# Patient Record
Sex: Female | Born: 1970 | Race: White | Hispanic: No | Marital: Married | Smoking: Never smoker
Health system: Southern US, Community
[De-identification: ages and names within clinical notes are randomized; demographics above are authoritative.]

## PROBLEM LIST (undated history)

## (undated) DIAGNOSIS — R569 Unspecified convulsions: Secondary | ICD-10-CM

## (undated) DIAGNOSIS — R51 Headache: Secondary | ICD-10-CM

## (undated) DIAGNOSIS — K219 Gastro-esophageal reflux disease without esophagitis: Secondary | ICD-10-CM

## (undated) DIAGNOSIS — R519 Headache, unspecified: Secondary | ICD-10-CM

## (undated) DIAGNOSIS — I10 Essential (primary) hypertension: Secondary | ICD-10-CM

## (undated) DIAGNOSIS — E063 Autoimmune thyroiditis: Secondary | ICD-10-CM

## (undated) DIAGNOSIS — E039 Hypothyroidism, unspecified: Secondary | ICD-10-CM

## (undated) HISTORY — PX: CERVICAL CONIZATION W/BX: SHX1330

## (undated) HISTORY — PX: WISDOM TOOTH EXTRACTION: SHX21

---

## 2004-09-14 ENCOUNTER — Ambulatory Visit (HOSPITAL_COMMUNITY): Admission: RE | Admit: 2004-09-14 | Discharge: 2004-09-14 | Payer: Self-pay | Admitting: Family Medicine

## 2006-06-25 ENCOUNTER — Ambulatory Visit (HOSPITAL_COMMUNITY): Admission: RE | Admit: 2006-06-25 | Discharge: 2006-06-25 | Payer: Self-pay | Admitting: Family Medicine

## 2006-12-31 ENCOUNTER — Ambulatory Visit (HOSPITAL_COMMUNITY): Admission: RE | Admit: 2006-12-31 | Discharge: 2006-12-31 | Payer: Self-pay | Admitting: Family Medicine

## 2008-07-27 ENCOUNTER — Ambulatory Visit (HOSPITAL_COMMUNITY): Admission: RE | Admit: 2008-07-27 | Discharge: 2008-07-27 | Payer: Self-pay | Admitting: Family Medicine

## 2008-12-29 ENCOUNTER — Ambulatory Visit (HOSPITAL_COMMUNITY): Admission: RE | Admit: 2008-12-29 | Discharge: 2008-12-29 | Payer: Self-pay | Admitting: Endocrinology

## 2009-08-19 ENCOUNTER — Ambulatory Visit (HOSPITAL_COMMUNITY): Admission: RE | Admit: 2009-08-19 | Discharge: 2009-08-19 | Payer: Self-pay | Admitting: Internal Medicine

## 2009-12-10 ENCOUNTER — Encounter (HOSPITAL_COMMUNITY): Admission: RE | Admit: 2009-12-10 | Discharge: 2010-01-09 | Payer: Self-pay | Admitting: Family Medicine

## 2010-04-14 ENCOUNTER — Encounter
Admission: RE | Admit: 2010-04-14 | Discharge: 2010-04-14 | Payer: Self-pay | Source: Home / Self Care | Attending: Obstetrics and Gynecology | Admitting: Obstetrics and Gynecology

## 2010-07-08 ENCOUNTER — Other Ambulatory Visit: Payer: Self-pay | Admitting: Endocrinology

## 2010-07-08 DIAGNOSIS — E041 Nontoxic single thyroid nodule: Secondary | ICD-10-CM

## 2010-07-25 ENCOUNTER — Ambulatory Visit
Admission: RE | Admit: 2010-07-25 | Discharge: 2010-07-25 | Disposition: A | Discharge status: Home / Self Care | Payer: Managed Care, Other (non HMO) | Source: Ambulatory Visit | Attending: Endocrinology | Admitting: Endocrinology

## 2010-07-25 DIAGNOSIS — E041 Nontoxic single thyroid nodule: Secondary | ICD-10-CM

## 2011-02-02 ENCOUNTER — Emergency Department (INDEPENDENT_AMBULATORY_CARE_PROVIDER_SITE_OTHER): Payer: Managed Care, Other (non HMO)

## 2011-02-02 ENCOUNTER — Encounter: Payer: Self-pay | Admitting: *Deleted

## 2011-02-02 ENCOUNTER — Emergency Department (HOSPITAL_COMMUNITY)
Admission: EM | Admit: 2011-02-02 | Discharge: 2011-02-02 | Disposition: A | Discharge status: Home / Self Care | Payer: Managed Care, Other (non HMO) | Source: Home / Self Care | Attending: Emergency Medicine | Admitting: Emergency Medicine

## 2011-02-02 DIAGNOSIS — S63619A Unspecified sprain of unspecified finger, initial encounter: Secondary | ICD-10-CM

## 2011-02-02 DIAGNOSIS — S6390XA Sprain of unspecified part of unspecified wrist and hand, initial encounter: Secondary | ICD-10-CM

## 2011-02-02 HISTORY — DX: Autoimmune thyroiditis: E06.3

## 2011-02-02 HISTORY — DX: Essential (primary) hypertension: I10

## 2011-02-02 MED ORDER — IBUPROFEN 800 MG PO TABS
ORAL_TABLET | ORAL | Status: AC
  Filled 2011-02-02: qty 1

## 2011-02-02 MED ORDER — IBUPROFEN 800 MG PO TABS
800.0000 mg | ORAL_TABLET | Freq: Once | ORAL | Status: AC
  Administered 2011-02-02: 800 mg via ORAL

## 2011-02-02 MED ORDER — IBUPROFEN 800 MG PO TABS: 800.0000 mg | ORAL_TABLET | Freq: Once | ORAL | Status: AC

## 2011-02-02 NOTE — ED Notes (Signed)
Reports falling onto right hand with RUE behind her @ approx 1600.  C/O pain distal right hand, 2nd & 3rd digits, radiating into right wrist.  Has not taken any measures to alleviate sxs.  CMS intact.

## 2011-02-02 NOTE — ED Provider Notes (Signed)
Medical screening examination/treatment/procedure(s) were performed by non-physician practitioner and as supervising physician I was immediately available for consultation/collaboration.  Raynald Blend, MD 02/02/11 2206

## 2011-02-02 NOTE — ED Provider Notes (Signed)
History     CSN: [id number] Arrival date & time: 02/02/2011  7:42 PM   First MD Initiated Contact with Patient 02/02/11 1953      Chief Complaint  Patient presents with  . Extremity Pain    Right Hand & 2nd, 3rd digit Pain; Fall    (Consider location/radiation/quality/duration/timing/severity/associated sxs/prior treatment) HPI Comments: Pt states she slipped on her brick walkway and fell backwards catching herself with her Rt hand. Pain Rt 2nd and 3rd digits with swelling. Minimal wrist pain. No other injuries. Denies HA or LOC.  Patient is a 40 y.o. female presenting with extremity pain. The history is provided by the patient.  Extremity Pain This is a new problem. The current episode started 3 to 5 hours ago. The problem occurs constantly. The problem has not changed since onset.Pertinent negatives include no chest pain, no abdominal pain, no headaches and no shortness of breath. The symptoms are aggravated by bending. The symptoms are relieved by nothing. She has tried nothing for the symptoms.    Past Medical History  Diagnosis Date  . Hashimoto disease   . Hypertension     Past Surgical History  Procedure Date  . Cervical conization w/bx     History reviewed. No pertinent family history.  History  Substance Use Topics  . Smoking status: Never Smoker   . Smokeless tobacco: Never Used  . Alcohol Use: No    OB History    Grav Para Term Preterm Abortions TAB SAB Ect Mult Living                  Review of Systems  Respiratory: Negative for shortness of breath.   Cardiovascular: Negative for chest pain.  Gastrointestinal: Negative for abdominal pain.  Musculoskeletal: Positive for joint swelling. Negative for back pain.  Skin: Negative for color change and wound.  Neurological: Negative for numbness and headaches.    Allergies  Amoxicillin  Home Medications   Current Outpatient Rx  Name Route Sig Dispense Refill  . SYNTHROID PO Oral Take by mouth.        Marland Kitchen LOSARTAN POTASSIUM 50 MG PO TABS Oral Take 50 mg by mouth daily.        BP 144/93  Pulse 90  Temp(Src) 98.3 F (36.8 C) (Oral)  Resp 16  SpO2 100%  Physical Exam  Constitutional: She appears well-developed and well-nourished. No distress.  HENT:  Head: Atraumatic.  Musculoskeletal:       Right hand: She exhibits bony tenderness (Mild TTP 2nd digit from MCP to PIP. Mild TTP 3rd digit MCP and proximal phalanx, with increased TTP 3rd digit at PIP) and swelling (3rd digit). She exhibits normal range of motion, normal capillary refill and no deformity. normal sensation noted. Normal strength noted.  Neurological: She is alert.  Skin: Skin is warm and dry. No abrasion, no bruising and no laceration noted. No erythema.  Psychiatric: She has a normal mood and affect.    ED Course  Procedures (including critical care time)  Labs Reviewed - No data to display No results found.   No diagnosis found.    MDM  Xray neg, reviewed by myself and radiologist.        Melody Comas, PA 02/02/11 2124

## 2011-02-24 ENCOUNTER — Other Ambulatory Visit: Payer: Self-pay | Admitting: Obstetrics and Gynecology

## 2011-02-24 DIAGNOSIS — Z1231 Encounter for screening mammogram for malignant neoplasm of breast: Secondary | ICD-10-CM

## 2011-04-17 ENCOUNTER — Ambulatory Visit
Admission: RE | Admit: 2011-04-17 | Discharge: 2011-04-17 | Disposition: A | Discharge status: Home / Self Care | Payer: Managed Care, Other (non HMO) | Source: Ambulatory Visit | Attending: Obstetrics and Gynecology | Admitting: Obstetrics and Gynecology

## 2011-04-17 ENCOUNTER — Other Ambulatory Visit: Payer: Self-pay | Admitting: Obstetrics and Gynecology

## 2011-04-17 DIAGNOSIS — Z1231 Encounter for screening mammogram for malignant neoplasm of breast: Secondary | ICD-10-CM

## 2011-11-13 ENCOUNTER — Emergency Department (HOSPITAL_COMMUNITY): Payer: Managed Care, Other (non HMO)

## 2011-11-13 ENCOUNTER — Encounter (HOSPITAL_COMMUNITY): Payer: Self-pay | Admitting: *Deleted

## 2011-11-13 ENCOUNTER — Emergency Department (HOSPITAL_COMMUNITY)
Admission: EM | Admit: 2011-11-13 | Discharge: 2011-11-14 | Disposition: A | Discharge status: Home / Self Care | Payer: Managed Care, Other (non HMO) | Attending: Emergency Medicine | Admitting: Emergency Medicine

## 2011-11-13 DIAGNOSIS — I10 Essential (primary) hypertension: Secondary | ICD-10-CM | POA: Insufficient documentation

## 2011-11-13 DIAGNOSIS — S92919A Unspecified fracture of unspecified toe(s), initial encounter for closed fracture: Secondary | ICD-10-CM

## 2011-11-13 DIAGNOSIS — E063 Autoimmune thyroiditis: Secondary | ICD-10-CM | POA: Insufficient documentation

## 2011-11-13 DIAGNOSIS — IMO0002 Reserved for concepts with insufficient information to code with codable children: Secondary | ICD-10-CM | POA: Insufficient documentation

## 2011-11-13 MED ORDER — HYDROCODONE-ACETAMINOPHEN 5-325 MG PO TABS: ORAL_TABLET | ORAL | Status: AC

## 2011-11-13 MED ORDER — IBUPROFEN 800 MG PO TABS
800.0000 mg | ORAL_TABLET | Freq: Once | ORAL | Status: AC
  Administered 2011-11-13: 800 mg via ORAL
  Filled 2011-11-13: qty 1

## 2011-11-13 MED ORDER — LIDOCAINE HCL (PF) 1 % IJ SOLN
INTRAMUSCULAR | Status: AC
  Administered 2011-11-13: 23:00:00
  Filled 2011-11-13: qty 5

## 2011-11-13 NOTE — ED Provider Notes (Signed)
History     CSN: [id number]  Arrival date & time 11/13/11  2249   First MD Initiated Contact with Patient 11/13/11 2306      Chief Complaint  Patient presents with  . Toe Injury    (Consider location/radiation/quality/duration/timing/severity/associated sxs/prior treatment) HPI Comments: Patient c/o pain and deformity of the left fifth toe that occurred when she struck her foot against a large trunk.  She denies ankle pain, numbness or weakness.  Patient is a 41 y.o. female presenting with toe pain. The history is provided by the patient.  Toe Pain This is a new problem. Episode onset: just PTA. The problem occurs constantly. The problem has been unchanged. Associated symptoms include arthralgias and joint swelling. Pertinent negatives include no chills, fever, numbness, vomiting or weakness. The symptoms are aggravated by standing and walking (palpation). She has tried nothing for the symptoms. The treatment provided no relief.    Past Medical History  Diagnosis Date  . Hashimoto disease   . Hypertension     Past Surgical History  Procedure Date  . Cervical conization w/bx     History reviewed. No pertinent family history.  History  Substance Use Topics  . Smoking status: Never Smoker   . Smokeless tobacco: Never Used  . Alcohol Use: No    OB History    Grav Para Term Preterm Abortions TAB SAB Ect Mult Living                  Review of Systems  Constitutional: Negative for fever and chills.  Gastrointestinal: Negative for vomiting.  Genitourinary: Negative for dysuria and difficulty urinating.  Musculoskeletal: Positive for joint swelling and arthralgias. Negative for back pain.  Skin: Negative for color change and wound.  Neurological: Negative for weakness and numbness.  All other systems reviewed and are negative.    Allergies  Amoxicillin  Home Medications   Current Outpatient Rx  Name Route Sig Dispense Refill  . SYNTHROID PO Oral Take by mouth.       Marland Kitchen LOSARTAN POTASSIUM 50 MG PO TABS Oral Take 50 mg by mouth daily.        BP 140/77  Pulse 93  Temp 98.4 F (36.9 C) (Oral)  Resp 20  Ht 5\' 6"  (1.676 m)  Wt 173 lb (78.472 kg)  BMI 27.92 kg/m2  SpO2 97%  LMP 10/28/2011  Physical Exam  Nursing note and vitals reviewed. Constitutional: She is oriented to person, place, and time. She appears well-developed and well-nourished. No distress.  HENT:  Head: Normocephalic and atraumatic.  Cardiovascular: Normal rate, regular rhythm, normal heart sounds and intact distal pulses.   Pulmonary/Chest: Effort normal and breath sounds normal.  Musculoskeletal: She exhibits tenderness.       Left foot: She exhibits decreased range of motion, tenderness, bony tenderness, swelling and deformity. She exhibits normal capillary refill and no laceration.       Feet:       Lateral deformity of the left fifth toe.  Mild STS.  Sensation intact.  DP pulse is brisk, sensation intact.  No bleeding or bruising.  Ankle is NT    Neurological: She is alert and oriented to person, place, and time. She exhibits normal muscle tone. Coordination normal.  Skin: Skin is warm and dry.    ED Course  ORTHOPEDIC INJURY TREATMENT Date/Time: 11/13/2011 11:15 PM Performed by: Trisha Mangle, Casy L. Authorized by: Maxwell Caul Consent: Verbal consent obtained. Written consent not obtained. Risks and benefits: risks, benefits and  alternatives were discussed Consent given by: patient Patient understanding: patient states understanding of the procedure being performed Patient consent: the patient's understanding of the procedure matches consent given Procedure consent: procedure consent matches procedure scheduled Imaging studies: imaging studies available Patient identity confirmed: verbally with patient and arm band Time out: Immediately prior to procedure a "time out" was called to verify the correct patient, procedure, equipment, support staff and site/side  marked as required. Injury location: toe Location details: left fifth toe Injury type: fracture-dislocation Fracture type: proximal phalanx Pre-procedure neurovascular assessment: neurovascularly intact Pre-procedure distal perfusion: normal Pre-procedure neurological function: normal Pre-procedure range of motion: reduced Local anesthesia used: digital block performed using 1% plain lidocaine. Patient sedated: no Manipulation performed: yes Skin traction used: noReduction successful: yes Immobilization: tape (buddy taped digits) Splint type: post-op boot. Post-procedure neurovascular assessment: post-procedure neurovascularly intact Post-procedure distal perfusion: normal Post-procedure neurological function: normal Post-procedure range of motion: improved Patient tolerance: Patient tolerated the procedure well with no immediate complications.   (including critical care time)  Labs Reviewed - No data to display Dg Toe 5th Left  11/13/2011  *RADIOLOGY REPORT*  Clinical Data: Injury and deformity to the left fifth toe.  DG TOE 5TH LEFT  Comparison: None.  Findings: There is oblique fracture of the mid shaft of the proximal phalanx of the left fifth toe with lateral angulation and overriding of the distal fracture fragment.  Soft tissue swelling is present.  Visualized bones otherwise appear intact.  IMPRESSION: Acute displaced fracture of the mid shaft proximal phalanx of left fifth toe.   Original Report Authenticated By: Marlon Pel, M.D.         MDM  Pain improved after reduction.  Alignment of the digit improved.  Remains NV intact.  Toes were buddy taped and post op shoe applied.    Patient agrees to follow-up with Dr. Romeo Apple for recheck.  Advised to elevate and apply ice packs on/off, keep toe buddy taped until recheck.    The patient appears reasonably screened and/or stabilized for discharge and I doubt any other medical condition or other [place] requiring further  screening, evaluation, or treatment in the ED at this time prior to discharge.     Jillana L. [place], [place] 11/17/11 1759

## 2011-11-13 NOTE — ED Notes (Signed)
Deformity of lt 5th toe.struck foot against a trunk.

## 2011-11-15 ENCOUNTER — Encounter: Payer: Self-pay | Admitting: Orthopedic Surgery

## 2011-11-15 ENCOUNTER — Ambulatory Visit (INDEPENDENT_AMBULATORY_CARE_PROVIDER_SITE_OTHER): Payer: Managed Care, Other (non HMO) | Admitting: Orthopedic Surgery

## 2011-11-15 VITALS — BP 128/88 | Ht 66.0 in | Wt 173.0 lb

## 2011-11-15 DIAGNOSIS — S92919A Unspecified fracture of unspecified toe(s), initial encounter for closed fracture: Secondary | ICD-10-CM

## 2011-11-15 NOTE — Patient Instructions (Addendum)
Tape toe for 4 weeks

## 2011-11-15 NOTE — Progress Notes (Signed)
  Subjective:    Patient ID: Rachel Mckenzie, female    DOB: Mar 24, 1971, 41 y.o.   MRN: [id number] Chief Complaint  Patient presents with  . Toe Injury    5th Left toe fracture, DOI 11/13/11    HPI  The patient sustained a fracture to the LEFT small toe on August 19 secondary to direct trauma. Complaint of sharp throbbing pain, which has now subsided to 3/10 with ibuprofen. The pain is intermittent, associated with swelling and bruising in the foot. She did require close reduction in the emergency room and.  Review of systems positive for occasional heart palpitations secondary to thyroid disease. Otherwise, her review of systems was completely normal  Past medical history family and social history are reviewed and recorded  Review of Systems     Objective:   Physical Exam  Nursing note and vitals reviewed. Constitutional: She is oriented to person, place, and time. She appears well-developed and well-nourished.  HENT:  Head: Normocephalic.  Eyes: Pupils are equal, round, and reactive to light.  Neck: Normal range of motion.  Cardiovascular: Intact distal pulses.   Pulmonary/Chest: Effort normal.  Abdominal: She exhibits no distension.  Musculoskeletal:       Left ankle: Normal. She exhibits normal range of motion, no swelling, no ecchymosis, no deformity and normal pulse. no tenderness.       Feet:  Lymphadenopathy:    She has no cervical adenopathy.  Neurological: She is alert and oriented to person, place, and time. She has normal reflexes.  Skin: Skin is warm and dry. No rash noted. No erythema. No pallor.  Psychiatric: She has a normal mood and affect. Her behavior is normal. Judgment and thought content normal.          Assessment & Plan:  Proximal phalanx fracture, LEFT small toe.  Recommendations continue taping. She is comfortable in the current shoes that she is wearing. A partial shoe did not fit well.  X-ray 4 weeks

## 2011-11-20 MED FILL — Hydrocodone-Acetaminophen Tab 5-325 MG: ORAL | Qty: 6 | Status: AC

## 2011-11-20 NOTE — ED Provider Notes (Signed)
Medical screening examination/treatment/procedure(s) were performed by non-physician practitioner and as supervising physician I was immediately available for consultation/collaboration.  Jceon Alverio S. Leonid Manus, MD 11/20/11 0635 

## 2011-12-13 ENCOUNTER — Ambulatory Visit: Payer: Managed Care, Other (non HMO) | Admitting: Orthopedic Surgery

## 2012-02-07 ENCOUNTER — Other Ambulatory Visit: Payer: Self-pay | Admitting: Obstetrics and Gynecology

## 2012-02-07 DIAGNOSIS — Z1231 Encounter for screening mammogram for malignant neoplasm of breast: Secondary | ICD-10-CM

## 2012-04-22 ENCOUNTER — Ambulatory Visit
Admission: RE | Admit: 2012-04-22 | Discharge: 2012-04-22 | Disposition: A | Discharge status: Home / Self Care | Payer: BC Managed Care – PPO | Source: Ambulatory Visit | Attending: Obstetrics and Gynecology | Admitting: Obstetrics and Gynecology

## 2012-04-22 DIAGNOSIS — Z1231 Encounter for screening mammogram for malignant neoplasm of breast: Secondary | ICD-10-CM

## 2012-05-03 ENCOUNTER — Other Ambulatory Visit: Payer: Self-pay | Admitting: Endocrinology

## 2012-05-03 DIAGNOSIS — E041 Nontoxic single thyroid nodule: Secondary | ICD-10-CM

## 2012-05-09 ENCOUNTER — Other Ambulatory Visit: Payer: BC Managed Care – PPO

## 2012-05-13 ENCOUNTER — Ambulatory Visit
Admission: RE | Admit: 2012-05-13 | Discharge: 2012-05-13 | Disposition: A | Discharge status: Home / Self Care | Payer: BC Managed Care – PPO | Source: Ambulatory Visit | Attending: Endocrinology | Admitting: Endocrinology

## 2012-05-13 DIAGNOSIS — E041 Nontoxic single thyroid nodule: Secondary | ICD-10-CM

## 2012-11-14 IMAGING — CR DG HAND COMPLETE 3+V*R*
3 series · 3 of 3 positions shown · non-contrast
Comparison: None.

CLINICAL DATA: Pain post fall.

RIGHT HAND - COMPLETE 3+ VIEW

[view not recorded (1 of 3)]
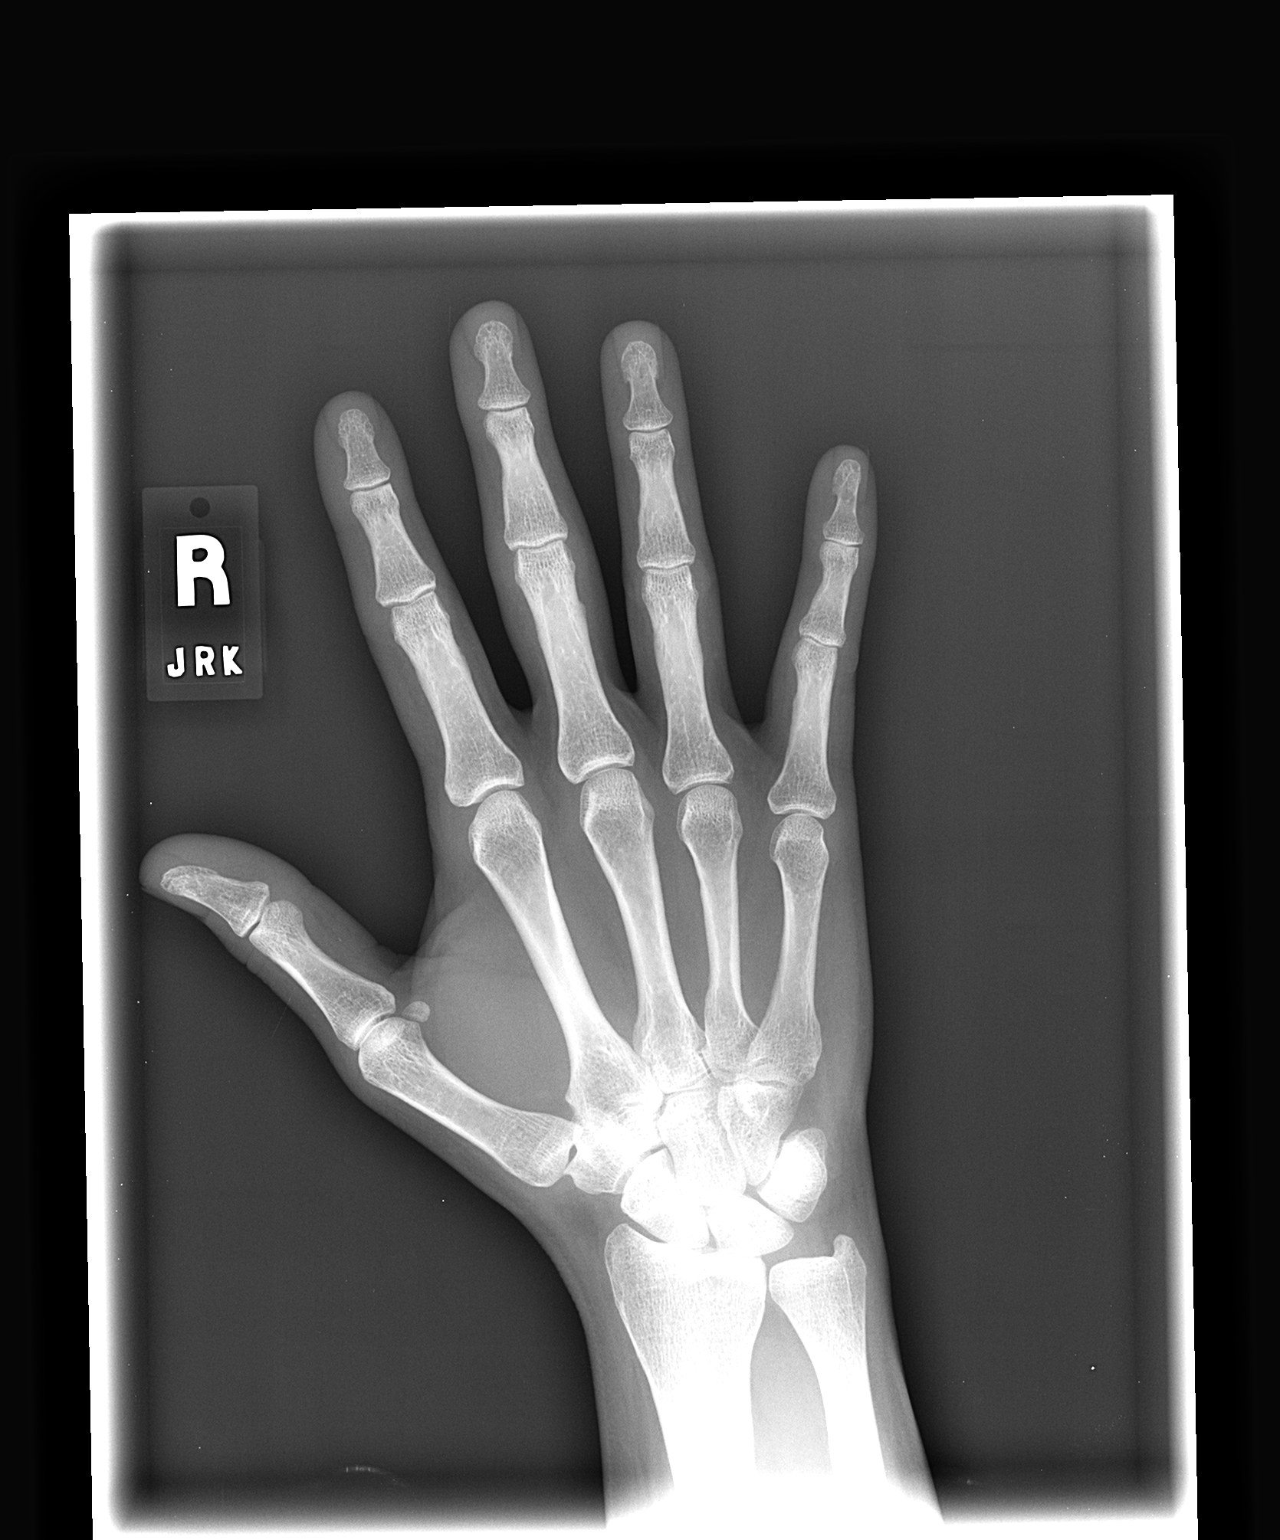

[view not recorded (2 of 3)]
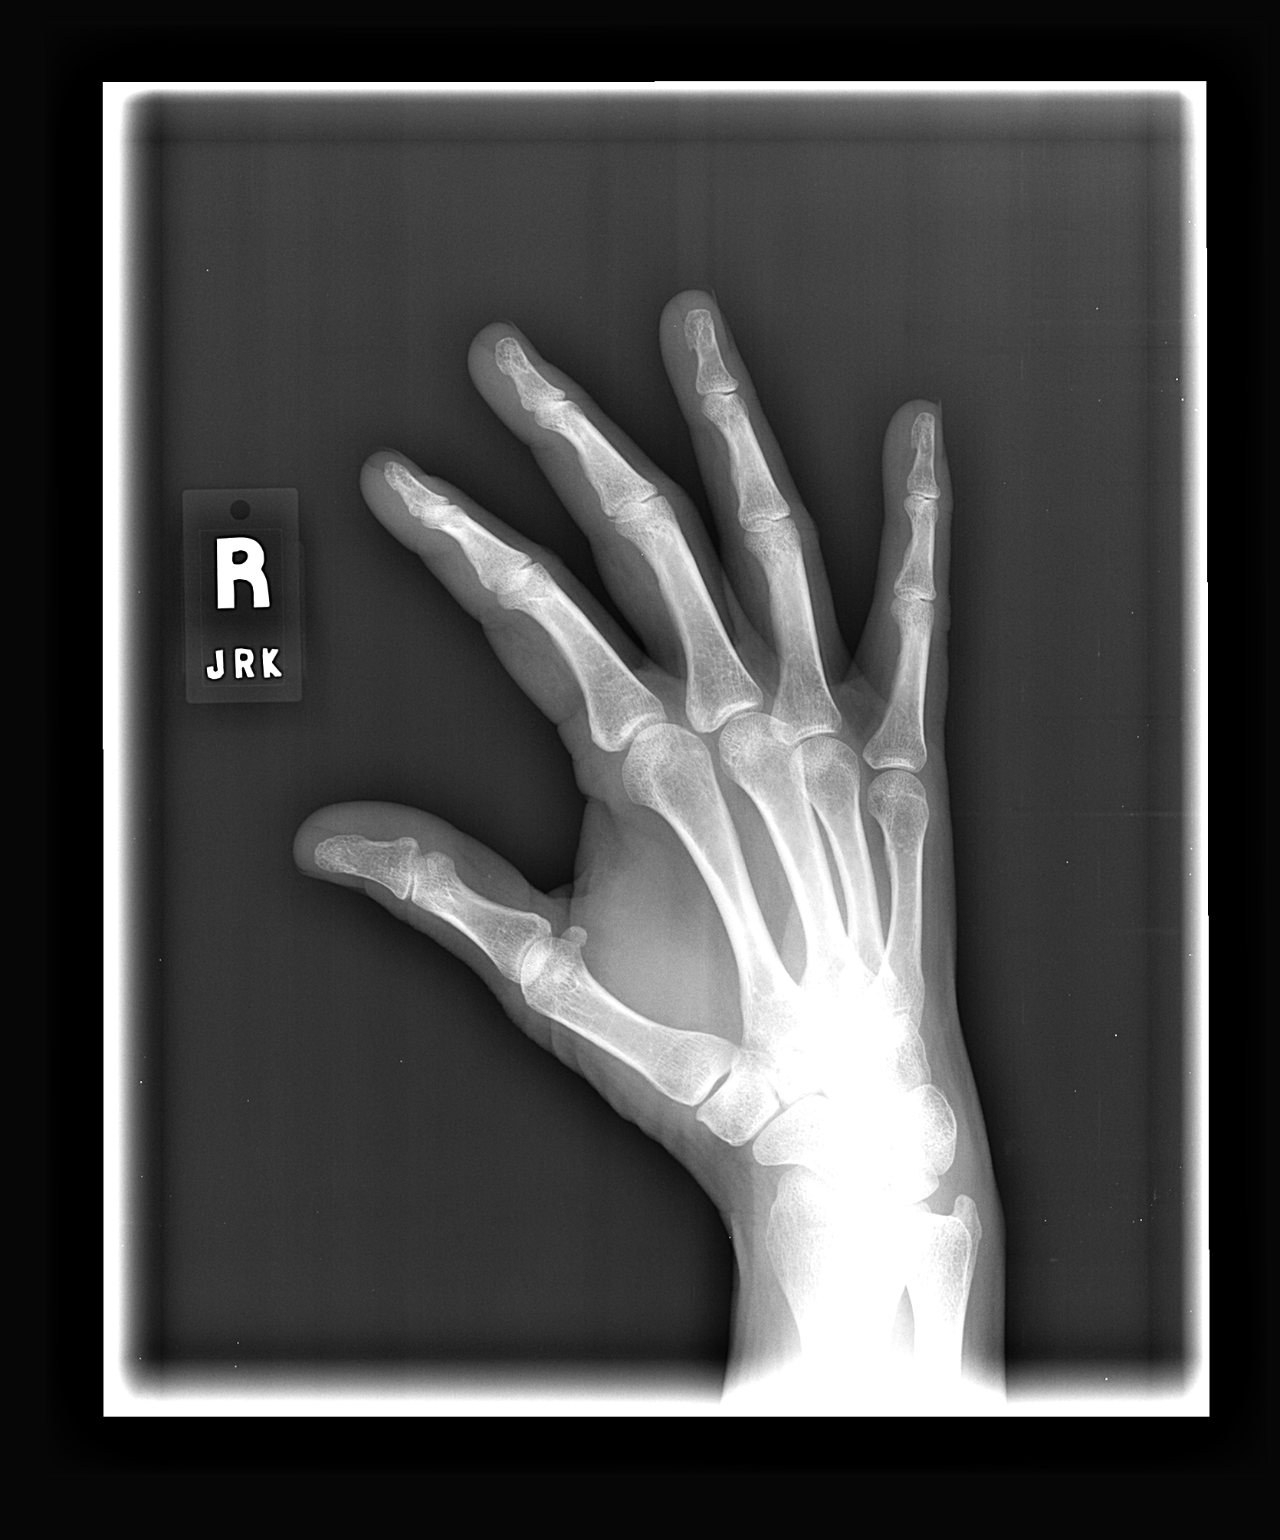

[view not recorded (3 of 3)]
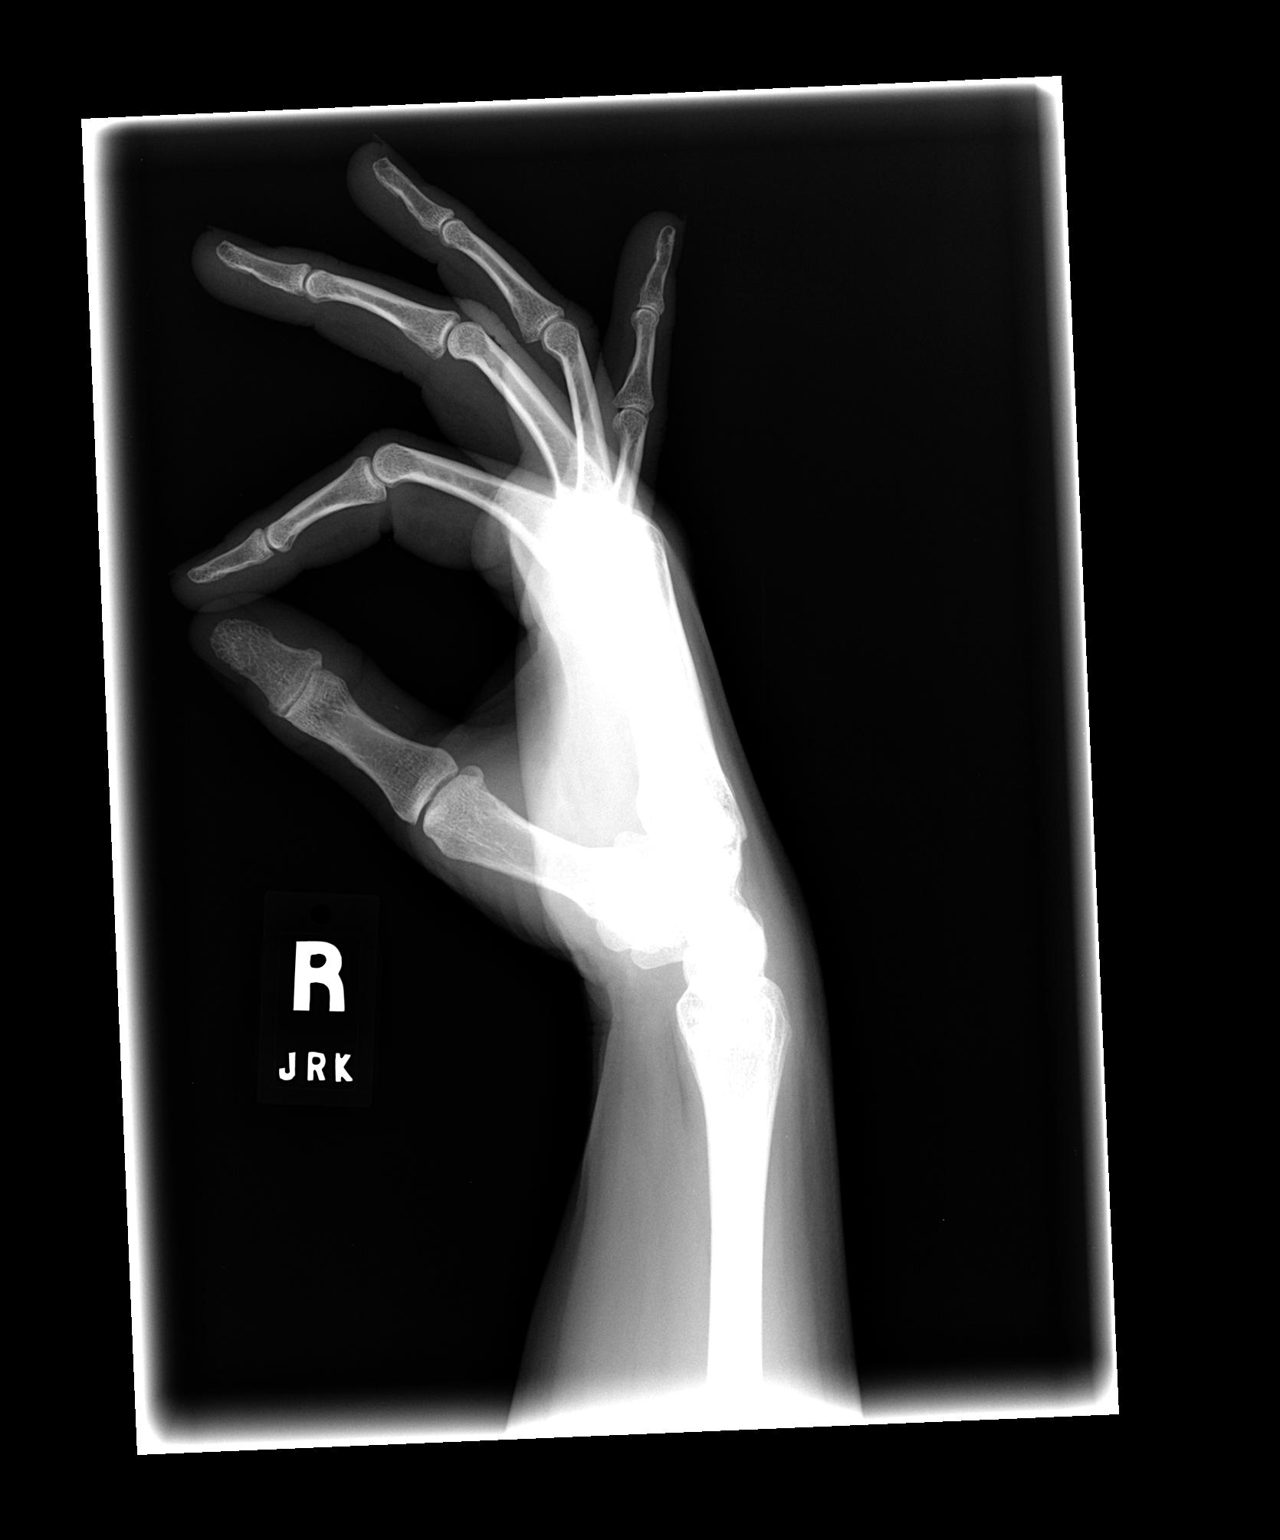

[3 of 3 positions shown; findings below may reference images not displayed]

FINDINGS: Negative for fracture, dislocation, or other acute
abnormality.  Normal alignment and mineralization. No significant
degenerative change.  Regional soft tissues unremarkable.
IMPRESSION: Negative

## 2012-12-30 ENCOUNTER — Other Ambulatory Visit: Payer: Self-pay

## 2012-12-30 DIAGNOSIS — Z1231 Encounter for screening mammogram for malignant neoplasm of breast: Secondary | ICD-10-CM

## 2013-04-17 ENCOUNTER — Other Ambulatory Visit: Payer: Self-pay

## 2013-04-17 ENCOUNTER — Ambulatory Visit
Admission: RE | Admit: 2013-04-17 | Discharge: 2013-04-17 | Disposition: A | Discharge status: Home / Self Care | Payer: BC Managed Care – PPO | Source: Ambulatory Visit

## 2013-04-17 DIAGNOSIS — Z1231 Encounter for screening mammogram for malignant neoplasm of breast: Secondary | ICD-10-CM

## 2013-04-25 ENCOUNTER — Ambulatory Visit: Payer: BC Managed Care – PPO

## 2013-05-09 ENCOUNTER — Ambulatory Visit: Payer: BC Managed Care – PPO

## 2013-08-25 IMAGING — CR DG TOE 5TH 2+V*L*
3 series · 3 of 3 positions shown · non-contrast
Comparison: None.

CLINICAL DATA: Injury and deformity to the left fifth toe.

DG TOE 5TH LEFT

[view not recorded (1 of 3)]
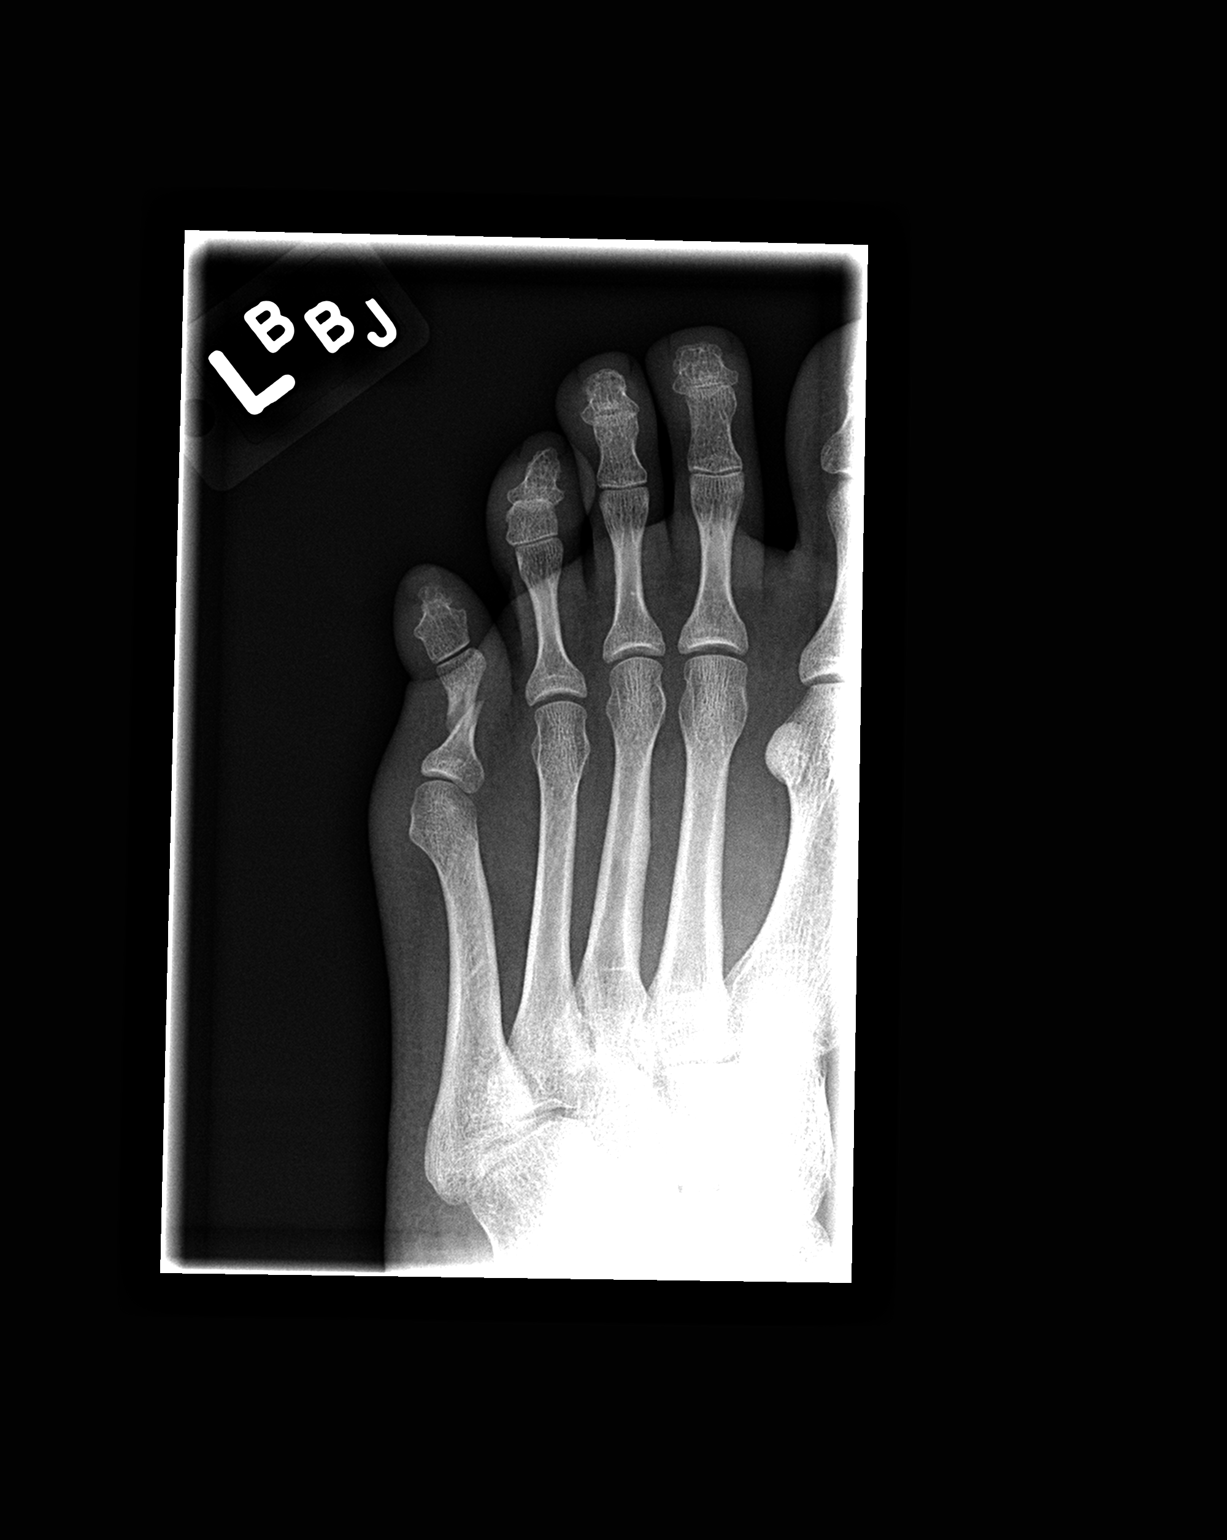

[view not recorded (2 of 3)]
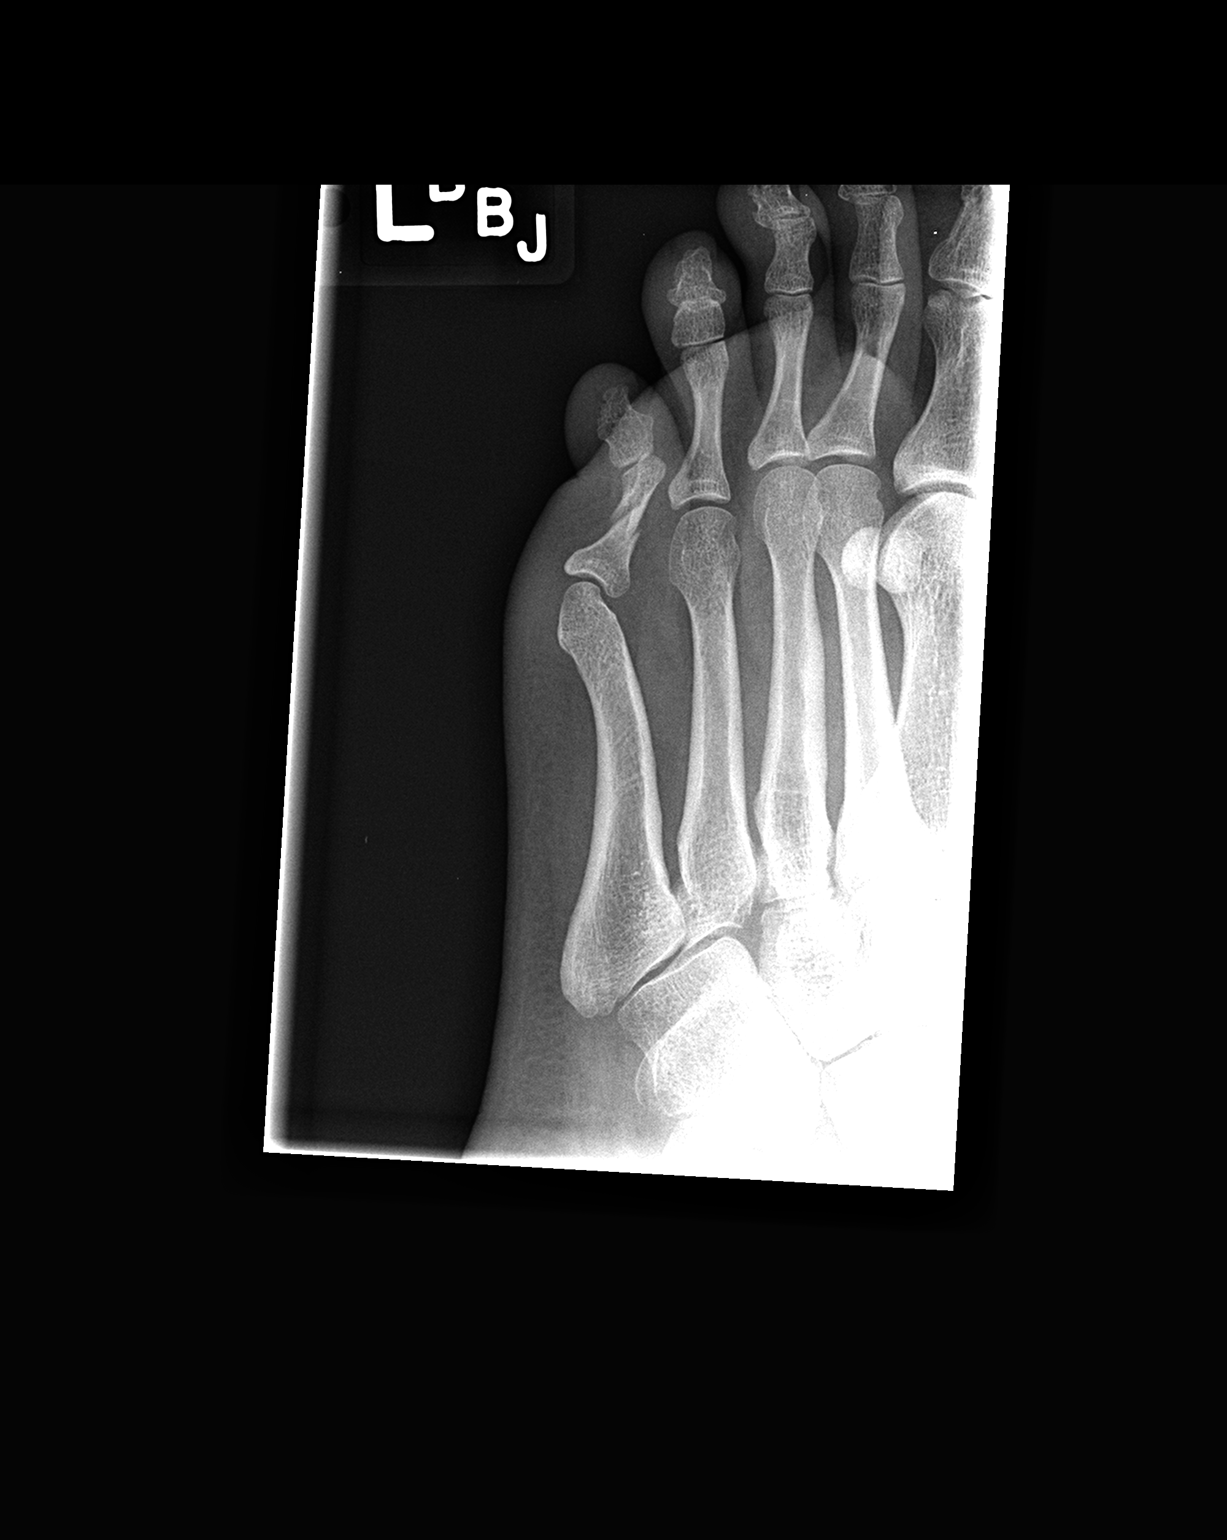

[view not recorded (3 of 3)]
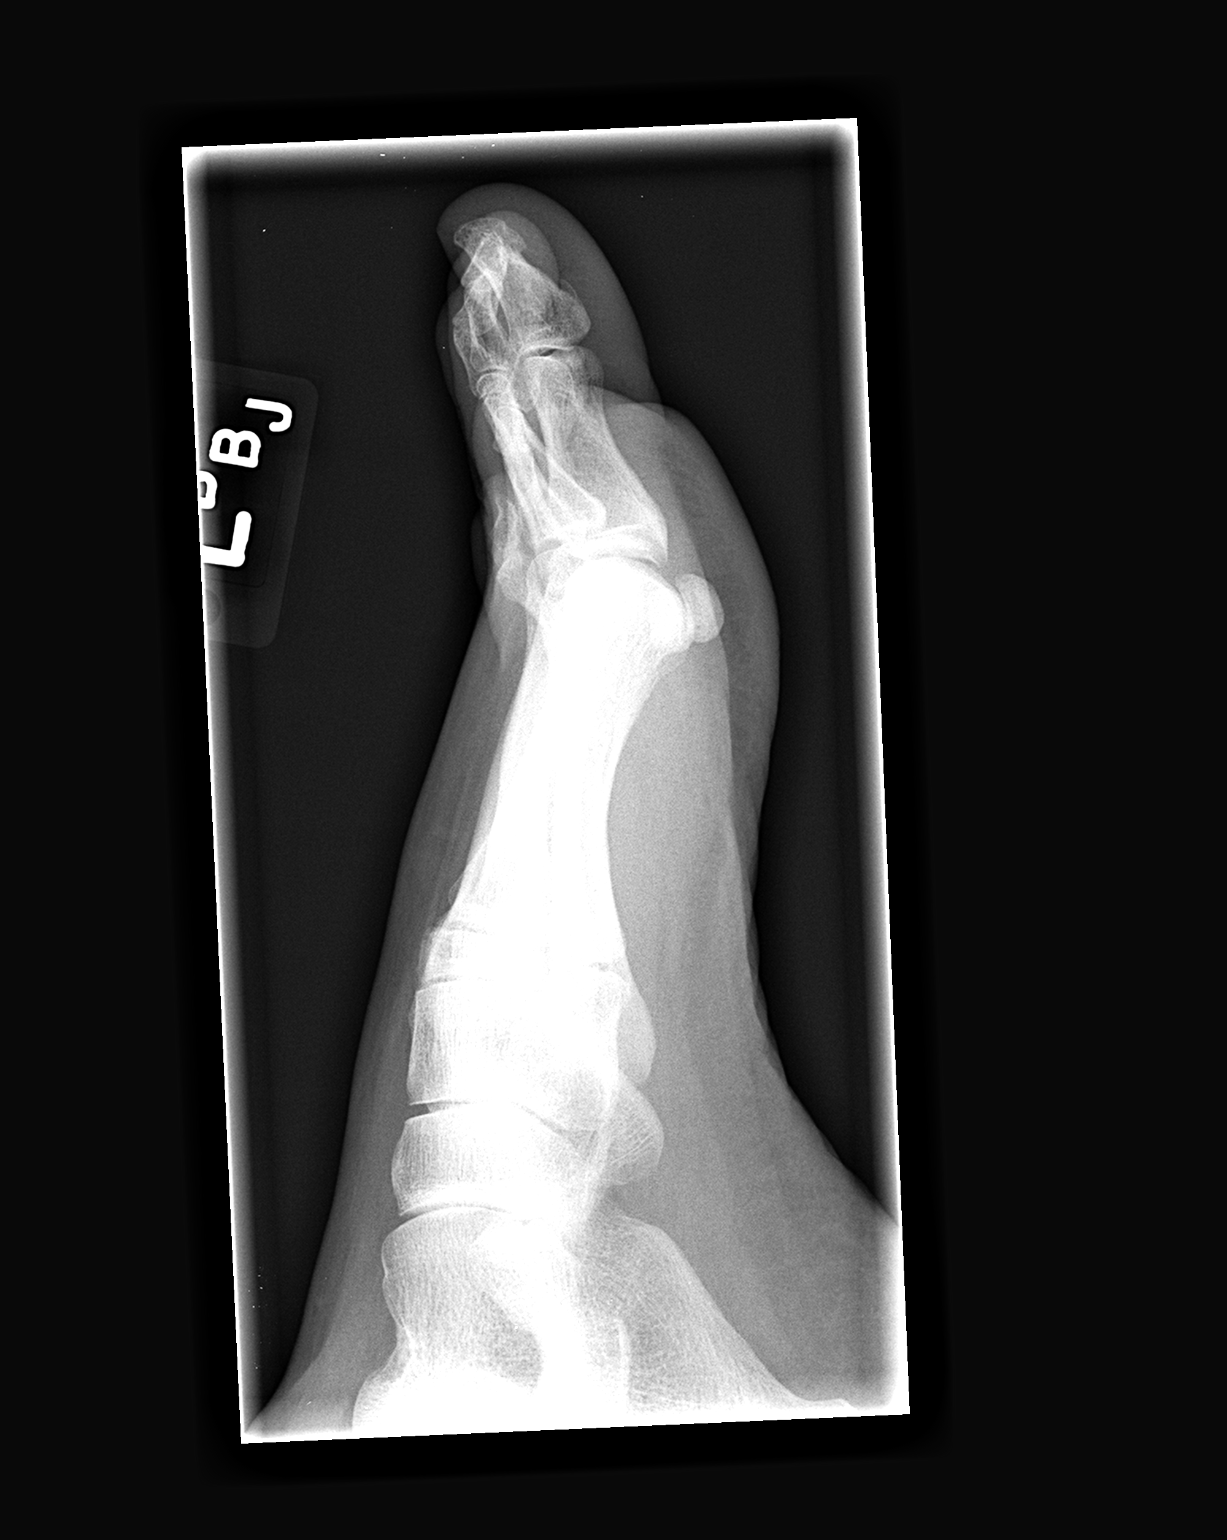

[3 of 3 positions shown; findings below may reference images not displayed]

FINDINGS: There is oblique fracture of the mid shaft of the
proximal phalanx of the left fifth toe with lateral angulation and
overriding of the distal fracture fragment.  Soft tissue swelling
is present.  Visualized bones otherwise appear intact.
IMPRESSION: Acute displaced fracture of the mid shaft proximal phalanx of left
fifth toe.

## 2013-09-16 ENCOUNTER — Other Ambulatory Visit: Payer: Self-pay | Admitting: Endocrinology

## 2013-09-16 DIAGNOSIS — E049 Nontoxic goiter, unspecified: Secondary | ICD-10-CM

## 2013-09-16 DIAGNOSIS — E0590 Thyrotoxicosis, unspecified without thyrotoxic crisis or storm: Secondary | ICD-10-CM

## 2013-09-18 ENCOUNTER — Encounter (INDEPENDENT_AMBULATORY_CARE_PROVIDER_SITE_OTHER): Payer: Self-pay

## 2013-09-18 ENCOUNTER — Ambulatory Visit
Admission: RE | Admit: 2013-09-18 | Discharge: 2013-09-18 | Disposition: A | Discharge status: Home / Self Care | Payer: BC Managed Care – PPO | Source: Ambulatory Visit | Attending: Endocrinology | Admitting: Endocrinology

## 2013-09-18 DIAGNOSIS — E049 Nontoxic goiter, unspecified: Secondary | ICD-10-CM

## 2013-09-18 DIAGNOSIS — E0590 Thyrotoxicosis, unspecified without thyrotoxic crisis or storm: Secondary | ICD-10-CM

## 2014-03-02 ENCOUNTER — Other Ambulatory Visit (HOSPITAL_COMMUNITY): Payer: Self-pay | Admitting: Family Medicine

## 2014-03-02 DIAGNOSIS — R1011 Right upper quadrant pain: Secondary | ICD-10-CM

## 2014-03-03 ENCOUNTER — Encounter (HOSPITAL_COMMUNITY)
Admission: RE | Admit: 2014-03-03 | Discharge: 2014-03-03 | Disposition: A | Discharge status: Home / Self Care | Payer: PRIVATE HEALTH INSURANCE | Source: Ambulatory Visit | Attending: Family Medicine | Admitting: Family Medicine

## 2014-03-03 ENCOUNTER — Encounter (HOSPITAL_COMMUNITY): Payer: Self-pay

## 2014-03-03 DIAGNOSIS — R1011 Right upper quadrant pain: Secondary | ICD-10-CM | POA: Diagnosis present

## 2014-03-03 MED ORDER — STERILE WATER FOR INJECTION IJ SOLN
INTRAMUSCULAR | Status: AC
  Administered 2014-03-03: 1.6 mL via INTRAVENOUS
  Filled 2014-03-03: qty 10

## 2014-03-03 MED ORDER — TECHNETIUM TC 99M MEBROFENIN IV KIT
5.0000 | PACK | Freq: Once | INTRAVENOUS | Status: AC | PRN
Start: 2014-03-03 — End: 2014-03-03
  Administered 2014-03-03: 5 via INTRAVENOUS

## 2014-03-03 MED ORDER — SODIUM CHLORIDE 0.9 % IJ SOLN
INTRAMUSCULAR | Status: AC
  Filled 2014-03-03: qty 30

## 2014-03-03 MED ORDER — SINCALIDE 5 MCG IJ SOLR
INTRAMUSCULAR | Status: AC
  Administered 2014-03-03: 1.6 ug via INTRAVENOUS
  Filled 2014-03-03: qty 5

## 2014-03-10 ENCOUNTER — Ambulatory Visit (HOSPITAL_COMMUNITY): Payer: BC Managed Care – PPO

## 2014-04-06 ENCOUNTER — Other Ambulatory Visit: Payer: Self-pay

## 2014-04-06 DIAGNOSIS — Z1231 Encounter for screening mammogram for malignant neoplasm of breast: Secondary | ICD-10-CM

## 2014-04-27 ENCOUNTER — Ambulatory Visit
Admission: RE | Admit: 2014-04-27 | Discharge: 2014-04-27 | Disposition: A | Discharge status: Home / Self Care | Payer: PRIVATE HEALTH INSURANCE | Source: Ambulatory Visit

## 2014-04-27 DIAGNOSIS — Z1231 Encounter for screening mammogram for malignant neoplasm of breast: Secondary | ICD-10-CM

## 2014-04-28 ENCOUNTER — Other Ambulatory Visit: Payer: Self-pay | Admitting: Obstetrics and Gynecology

## 2014-04-28 DIAGNOSIS — N63 Unspecified lump in unspecified breast: Secondary | ICD-10-CM

## 2014-04-28 DIAGNOSIS — N644 Mastodynia: Secondary | ICD-10-CM

## 2014-05-05 ENCOUNTER — Ambulatory Visit: Payer: PRIVATE HEALTH INSURANCE

## 2014-05-05 ENCOUNTER — Ambulatory Visit
Admission: RE | Admit: 2014-05-05 | Discharge: 2014-05-05 | Disposition: A | Discharge status: Home / Self Care | Payer: PRIVATE HEALTH INSURANCE | Source: Ambulatory Visit | Attending: Obstetrics and Gynecology | Admitting: Obstetrics and Gynecology

## 2014-05-05 DIAGNOSIS — N63 Unspecified lump in unspecified breast: Secondary | ICD-10-CM

## 2014-05-05 DIAGNOSIS — N644 Mastodynia: Secondary | ICD-10-CM

## 2015-04-06 ENCOUNTER — Other Ambulatory Visit: Payer: Self-pay

## 2015-04-06 DIAGNOSIS — Z1231 Encounter for screening mammogram for malignant neoplasm of breast: Secondary | ICD-10-CM

## 2015-05-31 ENCOUNTER — Ambulatory Visit
Admission: RE | Admit: 2015-05-31 | Discharge: 2015-05-31 | Disposition: A | Discharge status: Home / Self Care | Payer: Managed Care, Other (non HMO) | Source: Ambulatory Visit

## 2015-05-31 DIAGNOSIS — Z1231 Encounter for screening mammogram for malignant neoplasm of breast: Secondary | ICD-10-CM

## 2016-04-17 ENCOUNTER — Other Ambulatory Visit: Payer: Self-pay | Admitting: Obstetrics and Gynecology

## 2016-04-17 DIAGNOSIS — Z1231 Encounter for screening mammogram for malignant neoplasm of breast: Secondary | ICD-10-CM

## 2016-06-15 ENCOUNTER — Ambulatory Visit: Payer: Managed Care, Other (non HMO)

## 2016-06-19 ENCOUNTER — Other Ambulatory Visit: Payer: Self-pay | Admitting: Obstetrics and Gynecology

## 2016-06-19 DIAGNOSIS — R928 Other abnormal and inconclusive findings on diagnostic imaging of breast: Secondary | ICD-10-CM

## 2016-06-21 ENCOUNTER — Ambulatory Visit
Admission: RE | Admit: 2016-06-21 | Discharge: 2016-06-21 | Disposition: A | Discharge status: Home / Self Care | Payer: Managed Care, Other (non HMO) | Source: Ambulatory Visit | Attending: Obstetrics and Gynecology | Admitting: Obstetrics and Gynecology

## 2016-06-21 DIAGNOSIS — R928 Other abnormal and inconclusive findings on diagnostic imaging of breast: Secondary | ICD-10-CM

## 2016-09-06 NOTE — Patient Instructions (Addendum)
Your procedure is scheduled on:  Wednesday, June 27  [facility]15 am  Pick up the phone at the desk and dial [phone].  Call this number if you have problems the morning of surgery: [phone].  Remember: Do NOT eat or drink (including water) after midnight Tuesday.  Take these medicines the morning of surgery with a SIP OF WATER:  Armour  Do NOT wear jewelry (body piercing), metal hair clips/bobby pins, make-up, or nail polish. Do NOT wear lotions, powders, or perfumes.  You may wear deoderant. Do NOT shave for 48 hours prior to surgery. Do NOT bring valuables to the hospital. Contacts may not be worn into surgery.  Have a responsible adult drive you home and stay with you for 24 hours after your procedure.  Home with husband Fredrik CoveRoger cell [phone]

## 2016-09-08 ENCOUNTER — Encounter (HOSPITAL_COMMUNITY): Payer: Self-pay

## 2016-09-08 ENCOUNTER — Other Ambulatory Visit: Payer: Self-pay

## 2016-09-08 ENCOUNTER — Encounter (HOSPITAL_COMMUNITY)
Admission: RE | Admit: 2016-09-08 | Discharge: 2016-09-08 | Disposition: A | Discharge status: Home / Self Care | Payer: Managed Care, Other (non HMO) | Source: Ambulatory Visit | Attending: Obstetrics and Gynecology | Admitting: Obstetrics and Gynecology

## 2016-09-08 DIAGNOSIS — Z01818 Encounter for other preprocedural examination: Secondary | ICD-10-CM | POA: Diagnosis not present

## 2016-09-08 HISTORY — DX: Headache, unspecified: R51.9

## 2016-09-08 HISTORY — DX: Headache: R51

## 2016-09-08 HISTORY — DX: Unspecified convulsions: R56.9

## 2016-09-08 HISTORY — DX: Gastro-esophageal reflux disease without esophagitis: K21.9

## 2016-09-08 HISTORY — DX: Hypothyroidism, unspecified: E03.9

## 2016-09-08 LAB — BASIC METABOLIC PANEL
Anion gap: 7 (ref 5–15)
BUN: 17 mg/dL (ref 6–20)
CO2: 24 mmol/L (ref 22–32)
CREATININE: 0.8 mg/dL (ref 0.44–1.00)
Calcium: 9.7 mg/dL (ref 8.9–10.3)
Chloride: 107 mmol/L (ref 101–111)
GFR calc Af Amer: >60 mL/min (ref >60)
GLUCOSE: 117 mg/dL — AB (ref 65–99)
Potassium: 3.6 mmol/L (ref 3.5–5.1)
Sodium: 138 mmol/L (ref 135–145)

## 2016-09-08 LAB — CBC
HCT: 37.9 % (ref 36.0–46.0)
Hemoglobin: 12.9 g/dL (ref 12.0–15.0)
MCH: 29.5 pg (ref 26.0–34.0)
MCHC: 34 g/dL (ref 30.0–36.0)
MCV: 86.7 fL (ref 78.0–100.0)
Platelets: 315 10*3/uL (ref 150–400)
RBC: 4.37 MIL/uL (ref 3.87–5.11)
RDW: 12.8 % (ref 11.5–15.5)
WBC: 8.1 10*3/uL (ref 4.0–10.5)

## 2016-09-11 ENCOUNTER — Other Ambulatory Visit: Payer: Self-pay | Admitting: Obstetrics and Gynecology

## 2016-09-19 ENCOUNTER — Encounter (HOSPITAL_COMMUNITY): Payer: Self-pay | Admitting: *Deleted

## 2016-09-20 ENCOUNTER — Encounter (HOSPITAL_COMMUNITY)
Admission: RE | Disposition: A | Discharge status: Home / Self Care | Payer: Self-pay | Source: Ambulatory Visit | Attending: Obstetrics and Gynecology

## 2016-09-20 ENCOUNTER — Encounter (HOSPITAL_COMMUNITY): Payer: Self-pay | Admitting: *Deleted

## 2016-09-20 ENCOUNTER — Ambulatory Visit (HOSPITAL_COMMUNITY): Payer: Managed Care, Other (non HMO) | Admitting: Anesthesiology

## 2016-09-20 ENCOUNTER — Ambulatory Visit (HOSPITAL_COMMUNITY)
Admission: RE | Admit: 2016-09-20 | Discharge: 2016-09-20 | Disposition: A | Discharge status: Home / Self Care | Payer: Managed Care, Other (non HMO) | Source: Ambulatory Visit | Attending: Obstetrics and Gynecology | Admitting: Obstetrics and Gynecology

## 2016-09-20 DIAGNOSIS — K219 Gastro-esophageal reflux disease without esophagitis: Secondary | ICD-10-CM | POA: Diagnosis not present

## 2016-09-20 DIAGNOSIS — Z302 Encounter for sterilization: Secondary | ICD-10-CM | POA: Diagnosis present

## 2016-09-20 DIAGNOSIS — E039 Hypothyroidism, unspecified: Secondary | ICD-10-CM | POA: Diagnosis not present

## 2016-09-20 DIAGNOSIS — N802 Endometriosis of fallopian tube: Secondary | ICD-10-CM | POA: Insufficient documentation

## 2016-09-20 DIAGNOSIS — Z880 Allergy status to penicillin: Secondary | ICD-10-CM | POA: Insufficient documentation

## 2016-09-20 DIAGNOSIS — E063 Autoimmune thyroiditis: Secondary | ICD-10-CM | POA: Insufficient documentation

## 2016-09-20 DIAGNOSIS — I10 Essential (primary) hypertension: Secondary | ICD-10-CM | POA: Diagnosis not present

## 2016-09-20 HISTORY — PX: LAPAROSCOPIC SALPINGO OOPHERECTOMY: SHX5927

## 2016-09-20 HISTORY — PX: UNILATERAL SALPINGECTOMY: SHX6160

## 2016-09-20 LAB — ABO/RH: ABO/RH(D): O POS

## 2016-09-20 LAB — TYPE AND SCREEN
ABO/RH(D): O POS
Antibody Screen: NEGATIVE

## 2016-09-20 LAB — PREGNANCY, URINE: Preg Test, Ur: NEGATIVE

## 2016-09-20 SURGERY — SALPINGO-OOPHORECTOMY, LAPAROSCOPIC
Anesthesia: General | Site: Abdomen | Laterality: Right

## 2016-09-20 MED ORDER — BUPIVACAINE HCL (PF) 0.25 % IJ SOLN
INTRAMUSCULAR | Status: DC | PRN
  Administered 2016-09-20: 14 mL

## 2016-09-20 MED ORDER — ONDANSETRON HCL 4 MG/2ML IJ SOLN
INTRAMUSCULAR | Status: AC
  Filled 2016-09-20: qty 2

## 2016-09-20 MED ORDER — MEPERIDINE HCL 25 MG/ML IJ SOLN: 6.2500 mg | INTRAMUSCULAR | Status: DC | PRN

## 2016-09-20 MED ORDER — BUPIVACAINE HCL (PF) 0.25 % IJ SOLN
INTRAMUSCULAR | Status: AC
  Filled 2016-09-20: qty 30

## 2016-09-20 MED ORDER — KETOROLAC TROMETHAMINE 30 MG/ML IJ SOLN
INTRAMUSCULAR | Status: AC
  Filled 2016-09-20: qty 1

## 2016-09-20 MED ORDER — PROPOFOL 10 MG/ML IV BOLUS
INTRAVENOUS | Status: AC
  Filled 2016-09-20: qty 20

## 2016-09-20 MED ORDER — DOCUSATE SODIUM 100 MG PO CAPS: 100.0000 mg | ORAL_CAPSULE | Freq: Two times a day (BID) | ORAL | 0 refills | Status: DC

## 2016-09-20 MED ORDER — ROCURONIUM BROMIDE 100 MG/10ML IV SOLN
INTRAVENOUS | Status: DC | PRN
  Administered 2016-09-20: 5 mg via INTRAVENOUS
  Administered 2016-09-20: 10 mg via INTRAVENOUS
  Administered 2016-09-20: 40 mg via INTRAVENOUS

## 2016-09-20 MED ORDER — ROCURONIUM BROMIDE 100 MG/10ML IV SOLN
INTRAVENOUS | Status: AC
  Filled 2016-09-20: qty 1

## 2016-09-20 MED ORDER — KETOROLAC TROMETHAMINE 30 MG/ML IJ SOLN
INTRAMUSCULAR | Status: DC | PRN
  Administered 2016-09-20: 30 mg via INTRAVENOUS

## 2016-09-20 MED ORDER — MIDAZOLAM HCL 2 MG/2ML IJ SOLN
INTRAMUSCULAR | Status: DC | PRN
  Administered 2016-09-20: 2 mg via INTRAVENOUS

## 2016-09-20 MED ORDER — OXYCODONE HCL 5 MG PO TABS: 5.0000 mg | ORAL_TABLET | Freq: Once | ORAL | Status: DC | PRN

## 2016-09-20 MED ORDER — LACTATED RINGERS IR SOLN
Status: DC | PRN
  Administered 2016-09-20: 3000 mL

## 2016-09-20 MED ORDER — HYDROMORPHONE HCL 1 MG/ML IJ SOLN
INTRAMUSCULAR | Status: AC
  Administered 2016-09-20: 0.5 mg via INTRAVENOUS
  Filled 2016-09-20: qty 0.5

## 2016-09-20 MED ORDER — LACTATED RINGERS IV SOLN: INTRAVENOUS | Status: DC

## 2016-09-20 MED ORDER — PROMETHAZINE HCL 25 MG/ML IJ SOLN: 6.2500 mg | INTRAMUSCULAR | Status: DC | PRN

## 2016-09-20 MED ORDER — FENTANYL CITRATE (PF) 250 MCG/5ML IJ SOLN
INTRAMUSCULAR | Status: AC
  Filled 2016-09-20: qty 5

## 2016-09-20 MED ORDER — LACTATED RINGERS IV SOLN
INTRAVENOUS | Status: DC
  Administered 2016-09-20: 125 mL/h via INTRAVENOUS
  Administered 2016-09-20 (×2): via INTRAVENOUS

## 2016-09-20 MED ORDER — DEXAMETHASONE SODIUM PHOSPHATE 10 MG/ML IJ SOLN
INTRAMUSCULAR | Status: DC | PRN
  Administered 2016-09-20: 10 mg via INTRAVENOUS

## 2016-09-20 MED ORDER — SUGAMMADEX SODIUM 200 MG/2ML IV SOLN
INTRAVENOUS | Status: DC | PRN
  Administered 2016-09-20: 200 mg via INTRAVENOUS

## 2016-09-20 MED ORDER — LIDOCAINE HCL (CARDIAC) 20 MG/ML IV SOLN
INTRAVENOUS | Status: AC
  Filled 2016-09-20: qty 5

## 2016-09-20 MED ORDER — OXYCODONE-ACETAMINOPHEN 5-325 MG PO TABS: 1.0000 | ORAL_TABLET | ORAL | 0 refills | Status: DC | PRN

## 2016-09-20 MED ORDER — SCOPOLAMINE 1 MG/3DAYS TD PT72
MEDICATED_PATCH | TRANSDERMAL | Status: AC
  Administered 2016-09-20: 1.5 mg via TRANSDERMAL
  Filled 2016-09-20: qty 1

## 2016-09-20 MED ORDER — IBUPROFEN 600 MG PO TABS: 600.0000 mg | ORAL_TABLET | Freq: Four times a day (QID) | ORAL | 0 refills | Status: DC | PRN

## 2016-09-20 MED ORDER — OXYCODONE HCL 5 MG/5ML PO SOLN: 5.0000 mg | Freq: Once | ORAL | Status: DC | PRN

## 2016-09-20 MED ORDER — DEXAMETHASONE SODIUM PHOSPHATE 10 MG/ML IJ SOLN
INTRAMUSCULAR | Status: AC
  Filled 2016-09-20: qty 1

## 2016-09-20 MED ORDER — SCOPOLAMINE 1 MG/3DAYS TD PT72
1.0000 | MEDICATED_PATCH | Freq: Once | TRANSDERMAL | Status: DC
  Administered 2016-09-20: 1.5 mg via TRANSDERMAL

## 2016-09-20 MED ORDER — PROPOFOL 10 MG/ML IV BOLUS
INTRAVENOUS | Status: DC | PRN
  Administered 2016-09-20: 160 mg via INTRAVENOUS

## 2016-09-20 MED ORDER — LIDOCAINE HCL (CARDIAC) 20 MG/ML IV SOLN
INTRAVENOUS | Status: DC | PRN
  Administered 2016-09-20: 80 mg via INTRAVENOUS

## 2016-09-20 MED ORDER — HYDROMORPHONE HCL 1 MG/ML IJ SOLN
0.2500 mg | INTRAMUSCULAR | Status: DC | PRN
  Administered 2016-09-20: 0.5 mg via INTRAVENOUS

## 2016-09-20 MED ORDER — ONDANSETRON HCL 4 MG/2ML IJ SOLN
INTRAMUSCULAR | Status: DC | PRN
  Administered 2016-09-20: 4 mg via INTRAVENOUS

## 2016-09-20 MED ORDER — SUGAMMADEX SODIUM 200 MG/2ML IV SOLN
INTRAVENOUS | Status: AC
  Filled 2016-09-20: qty 2

## 2016-09-20 MED ORDER — MIDAZOLAM HCL 2 MG/2ML IJ SOLN
INTRAMUSCULAR | Status: AC
  Filled 2016-09-20: qty 2

## 2016-09-20 MED ORDER — FENTANYL CITRATE (PF) 100 MCG/2ML IJ SOLN
INTRAMUSCULAR | Status: DC | PRN
  Administered 2016-09-20 (×2): 25 ug via INTRAVENOUS
  Administered 2016-09-20 (×2): 100 ug via INTRAVENOUS

## 2016-09-20 SURGICAL SUPPLY — 33 items
APPLICATOR ARISTA FLEXITIP XL (MISCELLANEOUS) ×5 IMPLANT
CANISTER SUCT 3000ML PPV (MISCELLANEOUS) ×5 IMPLANT
CLOTH BEACON ORANGE TIMEOUT ST (SAFETY) ×5 IMPLANT
CONT PATH 16OZ SNAP LID 3702 (MISCELLANEOUS) IMPLANT
DECANTER SPIKE VIAL GLASS SM (MISCELLANEOUS) ×5 IMPLANT
DERMABOND ADVANCED (GAUZE/BANDAGES/DRESSINGS) ×2
DERMABOND ADVANCED .7 DNX12 (GAUZE/BANDAGES/DRESSINGS) ×3 IMPLANT
DRSG OPSITE POSTOP 3X4 (GAUZE/BANDAGES/DRESSINGS) ×5 IMPLANT
DURAPREP 26ML APPLICATOR (WOUND CARE) ×5 IMPLANT
FORCEPS CUTTING 33CM 5MM (CUTTING FORCEPS) IMPLANT
FORCEPS CUTTING 45CM 5MM (CUTTING FORCEPS) IMPLANT
GLOVE BIO SURGEON STRL SZ7 (GLOVE) ×5 IMPLANT
GLOVE BIOGEL PI IND STRL 7.0 (GLOVE) ×6 IMPLANT
GLOVE BIOGEL PI INDICATOR 7.0 (GLOVE) ×4
HEMOSTAT ARISTA ABSORB 3G PWDR (MISCELLANEOUS) ×5 IMPLANT
LIGASURE BLUNT 5MM 37CM (INSTRUMENTS) ×5 IMPLANT
NEEDLE HYPO 22GX1.5 SAFETY (NEEDLE) IMPLANT
NS IRRIG 1000ML POUR BTL (IV SOLUTION) ×5 IMPLANT
PACK LAPAROSCOPY BASIN (CUSTOM PROCEDURE TRAY) ×5 IMPLANT
PACK TRENDGUARD 450 HYBRID PRO (MISCELLANEOUS) ×3 IMPLANT
PACK TRENDGUARD 600 HYBRD PROC (MISCELLANEOUS) IMPLANT
POUCH LAPAROSCOPIC INSTRUMENT (MISCELLANEOUS) ×5 IMPLANT
POUCH SPECIMEN RETRIEVAL 10MM (ENDOMECHANICALS) IMPLANT
PROTECTOR NERVE ULNAR (MISCELLANEOUS) ×10 IMPLANT
SUT VIC AB 3-0 PS2 18 (SUTURE) ×2
SUT VIC AB 3-0 PS2 18XBRD (SUTURE) ×3 IMPLANT
SUT VICRYL 0 UR6 27IN ABS (SUTURE) ×10 IMPLANT
TOWEL OR 17X24 6PK STRL BLUE (TOWEL DISPOSABLE) ×10 IMPLANT
TRENDGUARD 450 HYBRID PRO PACK (MISCELLANEOUS) ×5
TRENDGUARD 600 HYBRID PROC PK (MISCELLANEOUS)
TROCAR BALLN 12MMX100 BLUNT (TROCAR) ×5 IMPLANT
TROCAR OPTI TIP 5M 100M (ENDOMECHANICALS) ×10 IMPLANT
WARMER LAPAROSCOPE (MISCELLANEOUS) ×5 IMPLANT

## 2016-09-20 NOTE — Anesthesia Postprocedure Evaluation (Signed)
Anesthesia Post Note  Patient: Rachel Mckenzie  Procedure(s) Performed: Procedure(s) (LRB): LAPAROSCOPIC SALPINGO OOPHORECTOMY (Left) UNILATERAL SALPINGECTOMY (Right)     Patient location during evaluation: PACU Anesthesia Type: General Level of consciousness: awake and alert Pain management: pain level controlled Vital Signs Assessment: post-procedure vital signs reviewed and stable Respiratory status: spontaneous breathing, nonlabored ventilation and respiratory function stable Cardiovascular status: blood pressure returned to baseline and stable Postop Assessment: no signs of nausea or vomiting Anesthetic complications: no    Last Vitals:  Vitals:   09/20/16 1500 09/20/16 1515  BP: (!) 110/59 103/68  Pulse: 84 86  Resp: 19 15  Temp:      Last Pain:  Vitals:   09/20/16 1515  TempSrc:   PainSc: 1    Pain Goal: Patients Stated Pain Goal: 4 (09/20/16 1515)               Lowella CurbWarren Ray Tyeler Goedken

## 2016-09-20 NOTE — Op Note (Addendum)
Pre-Operative Diagnosis: 1) Left adnexal mass, suspect endometrioma 1) desired permanent sterilization Postoperative Diagnosis: 1) Left adnexal mass, suspect endometrioma 1) desired permanent sterilization  Procedure: Laparoscopic left salpingoophorectomy, lysis of adhesions, right salpingectomy Surgeon: Dr. Waynard ReedsKendra Antino Mayabb Assistant: Dr. Essie HartWalda Pinn Anesthesia: Gen. Endotracheal anesthesia and 10 cc of 0.25% Marcaine injected infraumbilically. Operative Findings: Pelvic endometriosis, left ovarian endometrioma. Adhesive disease involving the left ovary and tube to the colon.  Specimen: Left tube and ovary, right tube EBL: Total I/O In: 1700 [I.V.:1700] Out: 250 [Urine:200; Blood:50]   Procedure: Ms. Allison QuarryCobb is an 46 year old  who presents for surgical management of a left adnexa mass that was suspected to be an endometrioma by ultrasound, and right salpingectomy for desired permanent sterilization. Risks benefits and alternatives of the procedure were discussed at length with the patient and she wishes to proceed. Following the appropriate informed consent the patient was taken to the operating room. She was placed in dorsal lithotomy position, and general anesthesia was administered. The abdomen, perineum, and vagina, were prepped in the normal sterile fashion. The patient was appropriately identified in a pre-operative time out procedure. Speculum was placed in the vagina and an acorn uterine manipulator was placed transcervically. The speculum was removed from the vagina. Attention was then turned to the abdominal portion of the case. Following the appropriate draping, 10 cc of 0.25% Marcaine was injected infraumbilically. Allis clamps were used to grasp and elevate the skin. A scalpel was used to make a semilunar infraumbilical incision. Blunt dissection was used down to the level of the fascia. The fascia was grasped with Coker clamps x2 and tented up and fascia was entered using Mayo scissors.  Intra-abdominal and she was confirmed with palpation. A Hassan port was placed, the balloon inflated, and pneumoperitoneum was achieved. The intra-abdominal cavity was inspected and findings were as above. The uterus was elevated and the patient was placed in Trendelenburg. A right lower quadrant 5 mm port site was placed under direct visualization after the skin was incised with a scalpel. Two left lower quadrant port sites were placed using the same procedure. Adhesive disease involving the left tube and ovary and the colon was taken down with a combination of blunt and sharp disection. The infundibulopelvic ligament was identified and skeletonized on the left. THe ligasure was used to clamp, cut and ligate the infundibulopelvic ligament. Successive bites were then taken down the mesosalpinx to the level of the fallopian tube insertion into the uterus. The fallopian tube was then transected. Attention was turned to the right fallopian tube. The Tube was elevated and the ligasure was used to cauterize and ligate the mesosalpinx. The tube was again transected at the level of the uterus. The surgical sites were inspected and found to be hemostatic. The surgical specimens were removed through the umbilical portsite in a 10 mm endobag. Arixtra was then placed over the surgical sites. The right and left lower quadrant ports were removed under direct visualization. The abdomen was desuflated and the umbilical port site removed. The umbilical fascia was closed with 0 vicryl. The skin was closed in a subcuticular fashion and dermabond. The right and left lower quadrant port sites were closed with dermabond. The patient tolerated the procedure well and was transferred to the PACU in stable condition

## 2016-09-20 NOTE — Transfer of Care (Signed)
Immediate Anesthesia Transfer of Care Note  Patient: Rachel Mckenzie  Procedure(s) Performed: Procedure(s): LAPAROSCOPIC SALPINGO OOPHORECTOMY (Left) UNILATERAL SALPINGECTOMY (Right)  Patient Location: PACU  Anesthesia Type:General  Level of Consciousness: awake  Airway & Oxygen Therapy: Patient Spontanous Breathing and Patient connected to nasal cannula oxygen  Post-op Assessment: Report given to RN and Post -op Vital signs reviewed and stable  Post vital signs: Reviewed and stable  Last Vitals:  Vitals:   09/20/16 1039 09/20/16 1137  BP: (!) 147/95 (!) 141/92  Pulse: (!) 114   Resp:  (!) 105  Temp:      Last Pain:  Vitals:   09/20/16 1015  TempSrc: Oral      Patients Stated Pain Goal: 4 (09/20/16 1015)  Complications: No apparent anesthesia complications

## 2016-09-20 NOTE — Anesthesia Procedure Notes (Signed)
Procedure Name: Intubation Date/Time: 09/20/2016 12:00 PM Performed by: Casimer Lanius A Pre-anesthesia Checklist: Patient identified, Emergency Drugs available, Suction available and Patient being monitored Patient Re-evaluated:Patient Re-evaluated prior to inductionOxygen Delivery Method: Circle system utilized Preoxygenation: Pre-oxygenation with 100% oxygen Intubation Type: IV induction Ventilation: Mask ventilation without difficulty Laryngoscope Size: Mac and 3 Grade View: Grade II Tube size: 7.0 mm Number of attempts: 1 Placement Confirmation: ETT inserted through vocal cords under direct vision,  positive ETCO2 and breath sounds checked- equal and bilateral Secured at: 21 cm Tube secured with: Tape Dental Injury: Teeth and Oropharynx as per pre-operative assessment

## 2016-09-20 NOTE — H&P (Signed)
Rachel Mckenzie is an 46 y.o. female.  46 yo presents for surgical management of a persistent left adnexal mass that on ultrasound appears c/w an endometrioma. Management options were reviewed with the patient including conservative management versus surgical management. Pt wishes to proceed with surgical management. Additionally she desires permanent sterilization and wishes to proceed with bilateral salpingectomy. R/B/A of surgical management were reviewed at length with the patient and she wishes to proceed with surgery  Dimensions of mass on last us on 09/01/2016 were 4.6 x 4.6 x 3.8 cm. No increased doppler signal involing the mass was noted. No free fluid in the pelvis     Patient's last menstrual period was 08/27/2016.    Past Medical History:  Diagnosis Date  . GERD (gastroesophageal reflux disease)    occasional - diet controlled - no meds  . Hashimoto disease   . Headache   . Hypertension   . Hypothyroidism   . Seizures (HCC)    only one at age 89 related to high fever, no problems since  . SVD (spontaneous vaginal delivery)    x 2    Past Surgical History:  Procedure Laterality Date  . CERVICAL CONIZATION W/BX    . WISDOM TOOTH EXTRACTION      Family History  Problem Relation Age of Onset  . Heart disease Unknown   . Arthritis Unknown   . Cancer Unknown   . Diabetes Unknown     Social History:  reports that she has never smoked. She has never used smokeless tobacco. She reports that she does not drink alcohol or use drugs.  Allergies:  Allergies  Allergen Reactions  . Amoxicillin Other (See Comments)    Chest Pain Has patient had a PCN reaction causing immediate rash, facial/tongue/throat swelling, SOB or lightheadedness with hypotension: No Has patient had a PCN reaction causing severe rash involving mucus membranes or skin necrosis: No Has patient had a PCN reaction that required hospitalization: No Has patient had a PCN reaction occurring within the last 10  years: No If all of the above answers are "NO", then may proceed with Cephalosporin use.     Prescriptions Prior to Admission  Medication Sig Dispense Refill Last Dose  . B Complex-C (B-COMPLEX WITH VITAMIN C) tablet Take 1 tablet by mouth every 7 (seven) days.   Past Month at Unknown time  . cholecalciferol (VITAMIN D) 400 units TABS tablet Take 400 Units by mouth every 7 (seven) days. Takes when remembers to take   Past Month at Unknown time  . diphenhydrAMINE (BENADRYL ALLERGY CHILDRENS) 12.5 MG chewable tablet Chew 12.5 mg by mouth daily as needed for allergies.   Past Month at Unknown time  . ibuprofen (ADVIL,MOTRIN) 200 MG tablet Take 400 mg by mouth every 8 (eight) hours as needed for mild pain.   09/17/2016 at Unknown time  . olmesartan-hydrochlorothiazide (BENICAR HCT) 20-12.5 MG per tablet Take 1 tablet by mouth daily at 8 pm.    Taking  . oxymetazoline (AFRIN) 0.05 % nasal spray Place 1-2 sprays into both nostrils 3 (three) times daily as needed for congestion (depends on congestion if 1-2 sprays).   09/19/2016 at Unknown time  . Salicylic Acid-Urea (KERASAL EX) Apply 1 application topically at bedtime.   Past Week at Unknown time  . thyroid (ARMOUR) 90 MG tablet Take 90-180 mg by mouth See admin instructions. Takes 90 mg daily except Saturday and Sundays and takes 180 mg (2 tablets)   09/20/2016 at Unknown time  .  Artificial Tear Solution (SOOTHE XP) SOLN Apply 1 drop to eye as needed (allergies).   Unknown at Unknown time    ROS  Blood pressure (!) 147/95, pulse (!) 114, temperature 98.3 F (36.8 C), temperature source Oral, resp. rate 18, last menstrual period 08/27/2016, SpO2 100 %. Physical Exam   AOX3, NAD Normal work of breathing Abd soft/NT/ND  No results found for this or any previous visit (from the past 24 hour(s)).  No results found.  Assessment/Plan: Proceed with laparoscopic LSO vs cystectomy and bilateral salpingectomy. R/B/A reviewed with the patient and she  wishes to proceed SCDS for DVT prophylaxis  Brihany Butch H. 09/20/2016, 10:54 AM

## 2016-09-20 NOTE — Anesthesia Preprocedure Evaluation (Signed)
Anesthesia Evaluation  Patient identified by MRN, date of birth, ID band Patient awake    Reviewed: Allergy & Precautions, NPO status , Patient's Chart, lab work & pertinent test results  Airway Mallampati: II  TM Distance: >3 FB Neck ROM: Full    Dental no notable dental hx.    Pulmonary neg pulmonary ROS,    Pulmonary exam normal breath sounds clear to auscultation       Cardiovascular hypertension, negative cardio ROS Normal cardiovascular exam Rhythm:Regular Rate:Normal     Neuro/Psych  Headaches, negative neurological ROS  negative psych ROS   GI/Hepatic negative GI ROS, Neg liver ROS, GERD  ,  Endo/Other  negative endocrine ROSHypothyroidism   Renal/GU negative Renal ROS  negative genitourinary   Musculoskeletal negative musculoskeletal ROS (+)   Abdominal   Peds negative pediatric ROS (+)  Hematology negative hematology ROS (+)   Anesthesia Other Findings   Reproductive/Obstetrics negative OB ROS                             Anesthesia Physical Anesthesia Plan  ASA: II  Anesthesia Plan: General   Post-op Pain Management:    Induction: Intravenous  PONV Risk Score and Plan: 4 or greater and Ondansetron, Dexamethasone, Propofol, Midazolam and Scopolamine patch - Pre-op  Airway Management Planned: Oral ETT  Additional Equipment:   Intra-op Plan:   Post-operative Plan: Extubation in OR  Informed Consent: I have reviewed the patients History and Physical, chart, labs and discussed the procedure including the risks, benefits and alternatives for the proposed anesthesia with the patient or authorized representative who has indicated his/her understanding and acceptance.   Dental advisory given  Plan Discussed with: CRNA  Anesthesia Plan Comments:         Anesthesia Quick Evaluation

## 2016-09-20 NOTE — Discharge Instructions (Signed)
DISCHARGE INSTRUCTIONS: Laparoscopy ° °The following instructions have been prepared to help you care for yourself upon your return home today. ° °Wound care: °• Do not get the incision wet for the first 24 hours. The incision should be kept clean and dry. °• The Band-Aids or dressings may be removed the day after surgery. °• Should the incision become sore, red, and swollen after the first week, check with your doctor. ° °Personal hygiene: °• Shower the day after your procedure. ° °Activity and limitations: °• Do NOT drive or operate any equipment today. °• Do NOT lift anything more than 15 pounds for 2-3 weeks after surgery. °• Do NOT rest in bed all day. °• Walking is encouraged. Walk each day, starting slowly with 5-minute walks 3 or 4 times a day. Slowly increase the length of your walks. °• Walk up and down stairs slowly. °• Do NOT do strenuous activities, such as golfing, playing tennis, bowling, running, biking, weight lifting, gardening, mowing, or vacuuming for 2-4 weeks. Ask your doctor when it is okay to start. ° °Diet: Eat a light meal as desired this evening. You may resume your usual diet tomorrow. ° °Return to work: This is dependent on the type of work you do. For the most part you can return to a desk job within a week of surgery. If you are more active at work, please discuss this with your doctor. ° °What to expect after your surgery: You may have a slight burning sensation when you urinate on the first day. You may have a very small amount of blood in the urine. Expect to have a small amount of vaginal discharge/light bleeding for 1-2 weeks. It is not unusual to have abdominal soreness and bruising for up to 2 weeks. You may be tired and need more rest for about 1 week. You may experience shoulder pain for 24-72 hours. Lying flat in bed may relieve it. ° °Call your doctor for any of the following: °• Develop a fever of 100.4 or greater °• Inability to urinate 6 hours after discharge from  hospital °• Severe pain not relieved by pain medications °• Persistent of heavy bleeding at incision site °• Redness or swelling around incision site after a week °• Increasing nausea or vomiting ° °Patient Signature________________________________________ °Nurse Signature_________________________________________ °Post Anesthesia Home Care Instructions ° °Activity: °Get plenty of rest for the remainder of the day. A responsible individual must stay with you for 24 hours following the procedure.  °For the next 24 hours, DO NOT: °-Drive a car °-Operate machinery °-Drink alcoholic beverages °-Take any medication unless instructed by your physician °-Make any legal decisions or sign important papers. ° °Meals: °Start with liquid foods such as gelatin or soup. Progress to regular foods as tolerated. Avoid greasy, spicy, heavy foods. If nausea and/or vomiting occur, drink only clear liquids until the nausea and/or vomiting subsides. Call your physician if vomiting continues. ° °Special Instructions/Symptoms: °Your throat may feel dry or sore from the anesthesia or the breathing tube placed in your throat during surgery. If this causes discomfort, gargle with warm salt water. The discomfort should disappear within 24 hours. ° °If you had a scopolamine patch placed behind your ear for the management of post- operative nausea and/or vomiting: ° °1. The medication in the patch is effective for 72 hours, after which it should be removed.  Wrap patch in a tissue and discard in the trash. Wash hands thoroughly with soap and water. °2. You may remove the patch   earlier than 72 hours if you experience unpleasant side effects which may include dry mouth, dizziness or visual disturbances. °3. Avoid touching the patch. Wash your hands with soap and water after contact with the patch. °   ° °

## 2016-09-21 ENCOUNTER — Encounter (HOSPITAL_COMMUNITY): Payer: Self-pay | Admitting: Obstetrics and Gynecology

## 2016-10-06 ENCOUNTER — Other Ambulatory Visit: Payer: Self-pay | Admitting: Endocrinology

## 2016-10-06 DIAGNOSIS — E041 Nontoxic single thyroid nodule: Secondary | ICD-10-CM

## 2016-10-13 ENCOUNTER — Other Ambulatory Visit: Payer: Managed Care, Other (non HMO)

## 2016-12-01 ENCOUNTER — Other Ambulatory Visit: Payer: Self-pay | Admitting: Endocrinology

## 2016-12-01 ENCOUNTER — Ambulatory Visit
Admission: RE | Admit: 2016-12-01 | Discharge: 2016-12-01 | Disposition: A | Discharge status: Home / Self Care | Payer: Managed Care, Other (non HMO) | Source: Ambulatory Visit | Attending: Endocrinology | Admitting: Endocrinology

## 2016-12-01 DIAGNOSIS — E041 Nontoxic single thyroid nodule: Secondary | ICD-10-CM

## 2021-09-07 ENCOUNTER — Other Ambulatory Visit: Payer: Self-pay | Admitting: Physician Assistant

## 2021-09-07 DIAGNOSIS — N23 Unspecified renal colic: Secondary | ICD-10-CM

## 2021-09-30 ENCOUNTER — Ambulatory Visit
Admission: RE | Admit: 2021-09-30 | Discharge: 2021-09-30 | Disposition: A | Discharge status: Home / Self Care | Payer: Self-pay | Source: Ambulatory Visit | Attending: Physician Assistant | Admitting: Physician Assistant

## 2021-09-30 DIAGNOSIS — N23 Unspecified renal colic: Secondary | ICD-10-CM

## 2022-04-20 ENCOUNTER — Other Ambulatory Visit: Payer: Self-pay | Admitting: Endocrinology

## 2022-04-20 DIAGNOSIS — E041 Nontoxic single thyroid nodule: Secondary | ICD-10-CM

## 2022-04-26 ENCOUNTER — Ambulatory Visit
Admission: RE | Admit: 2022-04-26 | Discharge: 2022-04-26 | Disposition: A | Discharge status: Home / Self Care | Payer: Managed Care, Other (non HMO) | Source: Ambulatory Visit | Attending: Endocrinology | Admitting: Endocrinology

## 2022-04-26 DIAGNOSIS — E041 Nontoxic single thyroid nodule: Secondary | ICD-10-CM

## 2022-05-03 ENCOUNTER — Other Ambulatory Visit: Payer: Managed Care, Other (non HMO)

## 2023-01-30 ENCOUNTER — Ambulatory Visit
Admission: RE | Admit: 2023-01-30 | Discharge: 2023-01-30 | Disposition: A | Discharge status: Home / Self Care | Payer: Managed Care, Other (non HMO) | Source: Ambulatory Visit | Attending: Nurse Practitioner | Admitting: Nurse Practitioner

## 2023-01-30 VITALS — BP 130/73 | HR 87 | Temp 98.1°F | Resp 18

## 2023-01-30 DIAGNOSIS — J0140 Acute pansinusitis, unspecified: Secondary | ICD-10-CM | POA: Diagnosis not present

## 2023-01-30 MED ORDER — PSEUDOEPH-BROMPHEN-DM 30-2-10 MG/5ML PO SYRP: 5.0000 mL | ORAL_SOLUTION | Freq: Four times a day (QID) | ORAL | 0 refills | Status: AC | PRN

## 2023-01-30 MED ORDER — FLUTICASONE PROPIONATE 50 MCG/ACT NA SUSP: 2.0000 | Freq: Every day | NASAL | 0 refills | Status: AC

## 2023-01-30 MED ORDER — AZITHROMYCIN 250 MG PO TABS: 250.0000 mg | ORAL_TABLET | Freq: Every day | ORAL | 0 refills | Status: AC

## 2023-01-30 NOTE — ED Triage Notes (Signed)
Sinus pressure x 1 month.  States ears hurt x 1 week and now has chest congestion.  Has been taking mucinex

## 2023-01-30 NOTE — ED Provider Notes (Signed)
[facility]    CSN: [id number] Arrival date & time: 01/30/23  1427      History   Chief Complaint Chief Complaint  Patient presents with   Ear Fullness    Sinuses for past month. Moving into my ears and chest. - Entered by patient    HPI Rachel Mckenzie is a 52 y.o. female.   The history is provided by the patient.   Patient presents for complaints of nasal congestion, sinus pressure, bilateral ear pain, nasal congestion, and chest congestion.  Symptoms have been present for the past month, with ear symptoms starting over the past week.  Patient denies fever, chills, headache, sore throat, wheezing, difficulty breathing, chest pain, abdominal pain, nausea, vomiting, or diarrhea.  Patient reports that she does have history of sinusitis, but she has not had one in quite some time.  She reports she has been using over-the-counter Mucinex for her symptoms with minimal relief.  Past Medical History:  Diagnosis Date   GERD (gastroesophageal reflux disease)    occasional - diet controlled - no meds   Hashimoto disease    Headache    Hypertension    Hypothyroidism    Seizures (HCC)    only one at age 52 related to high fever, no problems since   SVD (spontaneous vaginal delivery)    x 2    Patient Active Problem List   Diagnosis Date Noted   Fracture of phalanx of foot 11/15/2011    Past Surgical History:  Procedure Laterality Date   CERVICAL CONIZATION W/BX     LAPAROSCOPIC SALPINGO OOPHERECTOMY Left 09/20/2016   Procedure: LAPAROSCOPIC SALPINGO OOPHORECTOMY;  Surgeon: Waynard Reeds, MD;  Location: [facility];  Service: Gynecology;  Laterality: Left;   UNILATERAL SALPINGECTOMY Right 09/20/2016   Procedure: UNILATERAL SALPINGECTOMY;  Surgeon: Waynard Reeds, MD;  Location: [facility];  Service: Gynecology;  Laterality: Right;   WISDOM TOOTH EXTRACTION      OB History   No obstetric history on file.      Home Medications    Prior to Admission medications   Medication  Sig Start Date End Date Taking? Authorizing Provider  azithromycin (ZITHROMAX) 250 MG tablet Take 1 tablet (250 mg total) by mouth daily. Take first 2 tablets together, then 1 every day until finished. 01/30/23  Yes Leath-Warren, Sadie Haber, NP  brompheniramine-pseudoephedrine-DM 30-2-10 MG/5ML syrup Take 5 mLs by mouth 4 (four) times daily as needed. 01/30/23  Yes Leath-Warren, Sadie Haber, NP  fluticasone (FLONASE) 50 MCG/ACT nasal spray Place 2 sprays into both nostrils daily. 01/30/23  Yes Leath-Warren, Sadie Haber, NP  olmesartan-hydrochlorothiazide (BENICAR HCT) 20-12.5 MG per tablet Take 1 tablet by mouth daily at 8 pm.     [provider]  thyroid (ARMOUR) 90 MG tablet Take 90-180 mg by mouth See admin instructions. Takes 90 mg daily except Saturday and Sundays and takes 180 mg (2 tablets)    [provider]    Family History Family History  Problem Relation Age of Onset   Heart disease Unknown    Arthritis Unknown    Cancer Unknown    Diabetes Unknown     Social History Social History   Tobacco Use   Smoking status: Never   Smokeless tobacco: Never  Vaping Use   Vaping status: Never Used  Substance Use Topics   Alcohol use: No   Drug use: No     Allergies   Amoxicillin   Review of Systems Review of Systems Per HPI  Physical Exam Triage Vital Signs ED Triage Vitals  Encounter Vitals Group     BP 01/30/23 1434 130/73     Systolic BP Percentile --      Diastolic BP Percentile --      Pulse Rate 01/30/23 1434 87     Resp 01/30/23 1434 18     Temp 01/30/23 1434 98.1 F (36.7 C)     Temp Source 01/30/23 1434 Oral     SpO2 01/30/23 1434 96 %     Weight --      Height --      Head Circumference --      Peak Flow --      Pain Score 01/30/23 1435 1     Pain Loc --      Pain Education --      Exclude from Growth Chart --    No data found.  Updated Vital Signs BP 130/73 (BP Location: Right Arm)   Pulse 87   Temp 98.1 F (36.7 C) (Oral)    Resp 18   LMP 08/27/2016   SpO2 96%   Visual Acuity Right Eye Distance:   Left Eye Distance:   Bilateral Distance:    Right Eye Near:   Left Eye Near:    Bilateral Near:     Physical Exam Vitals and nursing note reviewed.  Constitutional:      General: She is not in acute distress.    Appearance: Normal appearance.  HENT:     Head: Normocephalic.     Right Ear: Tympanic membrane, ear canal and external ear normal.     Left Ear: Ear canal and external ear normal. A middle ear effusion is present.     Nose: Congestion present.     Right Turbinates: Enlarged and swollen.     Left Turbinates: Enlarged and swollen.     Right Sinus: Maxillary sinus tenderness and frontal sinus tenderness present.     Left Sinus: Maxillary sinus tenderness and frontal sinus tenderness present.     Mouth/Throat:     Lips: Pink.     Mouth: Mucous membranes are moist.     Pharynx: Uvula midline. Postnasal drip present. No pharyngeal swelling, oropharyngeal exudate, posterior oropharyngeal erythema or uvula swelling.     Comments: Cobblestoning present to posterior oropharynx  Eyes:     Extraocular Movements: Extraocular movements intact.     Conjunctiva/sclera: Conjunctivae normal.     Pupils: Pupils are equal, round, and reactive to light.  Cardiovascular:     Rate and Rhythm: Normal rate and regular rhythm.     Pulses: Normal pulses.     Heart sounds: Normal heart sounds.  Pulmonary:     Effort: Pulmonary effort is normal. No respiratory distress.     Breath sounds: Normal breath sounds. No stridor. No wheezing, rhonchi or rales.  Abdominal:     General: Bowel sounds are normal.     Palpations: Abdomen is soft.     Tenderness: There is no abdominal tenderness.  Musculoskeletal:     Cervical back: Normal range of motion.  Lymphadenopathy:     Cervical: No cervical adenopathy.  Skin:    General: Skin is warm and dry.  Neurological:     General: No focal deficit present.     Mental Status:  She is alert and oriented to person, place, and time.  Psychiatric:        Mood and Affect: Mood normal.        Behavior: Behavior  normal.      UC Treatments / Results  Labs (all labs ordered are listed, but only abnormal results are displayed) Labs Reviewed - No data to display  EKG   Radiology No results found.  Procedures Procedures (including critical care time)  Medications Ordered in UC Medications - No data to display  Initial Impression / Assessment and Plan / UC Course  I have reviewed the triage vital signs and the nursing notes.  Pertinent labs & imaging results that were available during my care of the patient were reviewed by me and considered in my medical decision making (see chart for details).  Patient with both maxillary and frontal sinus tenderness, bilateral ear pressure and pressure along with nasal congestion, and cough that been present for 1 month.  Symptoms consistent with acute pansinusitis.  Will treat with azithromycin 250 mg, for her cough, Bromfed-DM, and for the nasal congestion and bilateral ear pressure and fullness, fluticasone 50 mcg nasal spray.  Supportive care recommendations were provided and discussed with the patient to include over-the-counter analgesics, fluid, rest, normal saline nasal spray, and use of a humidifier at bedtime.  Also recommended beginning a daily allergy medication all symptoms persist.  Patient was given indications of follow-up will be necessary.  Patient is in agreement with this plan of care and verbalizes understanding.  All questions were answered.  Patient stable for discharge.  Final Clinical Impressions(s) / UC Diagnoses   Final diagnoses:  Acute pansinusitis, recurrence not specified     Discharge Instructions      Take medication as directed. You may begin an over-the-counter allergy medication such as Allegra, Claritin, or Zyrtec daily. Increase fluids and get plenty of rest. May take  over-the-counter ibuprofen or Tylenol as needed for pain, fever, or general discomfort. Recommend normal saline nasal spray to help with nasal congestion throughout the day. For your cough, it may be helpful to use a humidifier at bedtime during sleep. If your fail to improve with this treatment, you may follow-up in this clinic or with your primary care physician for further evaluation. Follow-up as needed.     ED Prescriptions     Medication Sig Dispense Auth. Provider   azithromycin (ZITHROMAX) 250 MG tablet Take 1 tablet (250 mg total) by mouth daily. Take first 2 tablets together, then 1 every day until finished. 6 tablet Leath-Warren, Sadie Haber, NP   fluticasone (FLONASE) 50 MCG/ACT nasal spray Place 2 sprays into both nostrils daily. 16 g Leath-Warren, Sadie Haber, NP   brompheniramine-pseudoephedrine-DM 30-2-10 MG/5ML syrup Take 5 mLs by mouth 4 (four) times daily as needed. 140 mL Leath-Warren, Sadie Haber, NP      PDMP not reviewed this encounter.   Abran Cantor, NP 01/30/23 1454

## 2023-01-30 NOTE — Discharge Instructions (Signed)
Take medication as directed. You may begin an over-the-counter allergy medication such as Allegra, Claritin, or Zyrtec daily. Increase fluids and get plenty of rest. May take over-the-counter ibuprofen or Tylenol as needed for pain, fever, or general discomfort. Recommend normal saline nasal spray to help with nasal congestion throughout the day. For your cough, it may be helpful to use a humidifier at bedtime during sleep. If your fail to improve with this treatment, you may follow-up in this clinic or with your primary care physician for further evaluation. Follow-up as needed.

## 2023-02-27 ENCOUNTER — Ambulatory Visit: Payer: Self-pay

## 2023-03-22 ENCOUNTER — Ambulatory Visit
Admission: RE | Admit: 2023-03-22 | Discharge: 2023-03-22 | Disposition: A | Discharge status: Home / Self Care | Payer: Managed Care, Other (non HMO) | Source: Ambulatory Visit | Attending: Nurse Practitioner | Admitting: Nurse Practitioner

## 2023-03-22 ENCOUNTER — Other Ambulatory Visit: Payer: Self-pay

## 2023-03-22 VITALS — BP 159/89 | HR 82 | Temp 98.7°F | Resp 20

## 2023-03-22 DIAGNOSIS — J0100 Acute maxillary sinusitis, unspecified: Secondary | ICD-10-CM | POA: Diagnosis not present

## 2023-03-22 MED ORDER — LEVOFLOXACIN 750 MG PO TABS: 750.0000 mg | ORAL_TABLET | Freq: Every day | ORAL | 0 refills | Status: AC

## 2023-03-22 NOTE — ED Provider Notes (Signed)
[facility]    CSN: [id number] Arrival date & time: 03/22/23  1331      History   Chief Complaint Chief Complaint  Patient presents with   Ear Fullness    Sinus infection moving into chest. Need stronger antibiotics than what was given last time. - Entered by patient    HPI Rachel Mckenzie is a 52 y.o. female.   The history is provided by the patient.   Patient presents for complaints of continued sinus symptoms.  Patient was seen in this clinic in November and prescribed azithromycin.  She was also prescribed Bromfed and fluticasone, states that her symptoms never fully improved.  She is returning today requesting a "stronger antibiotic.  She denies fever, chills, chest pain, abdominal pain, nausea, vomiting, diarrhea, or rash.  States that she is not coughing very much, but continues to complain of head congestion. Past Medical History:  Diagnosis Date   GERD (gastroesophageal reflux disease)    occasional - diet controlled - no meds   Hashimoto disease    Headache    Hypertension    Hypothyroidism    Seizures (HCC)    only one at age 20 related to high fever, no problems since   SVD (spontaneous vaginal delivery)    x 2    Patient Active Problem List   Diagnosis Date Noted   Fracture of phalanx of foot 11/15/2011    Past Surgical History:  Procedure Laterality Date   CERVICAL CONIZATION W/BX     LAPAROSCOPIC SALPINGO OOPHERECTOMY Left 09/20/2016   Procedure: LAPAROSCOPIC SALPINGO OOPHORECTOMY;  Surgeon: Waynard Reeds, MD;  Location: [facility];  Service: Gynecology;  Laterality: Left;   UNILATERAL SALPINGECTOMY Right 09/20/2016   Procedure: UNILATERAL SALPINGECTOMY;  Surgeon: Waynard Reeds, MD;  Location: [facility];  Service: Gynecology;  Laterality: Right;   WISDOM TOOTH EXTRACTION      OB History   No obstetric history on file.      Home Medications    Prior to Admission medications   Medication Sig Start Date End Date Taking? Authorizing Provider   azithromycin (ZITHROMAX) 250 MG tablet Take 1 tablet (250 mg total) by mouth daily. Take first 2 tablets together, then 1 every day until finished. 01/30/23   Leath-Warren, Sadie Haber, NP  brompheniramine-pseudoephedrine-DM 30-2-10 MG/5ML syrup Take 5 mLs by mouth 4 (four) times daily as needed. 01/30/23   Leath-Warren, Sadie Haber, NP  fluticasone (FLONASE) 50 MCG/ACT nasal spray Place 2 sprays into both nostrils daily. 01/30/23   Leath-Warren, Sadie Haber, NP  olmesartan-hydrochlorothiazide (BENICAR HCT) 20-12.5 MG per tablet Take 1 tablet by mouth daily at 8 pm.     [provider]  thyroid (ARMOUR) 90 MG tablet Take 90-180 mg by mouth See admin instructions. Takes 90 mg daily except Saturday and Sundays and takes 180 mg (2 tablets)    [provider]    Family History Family History  Problem Relation Age of Onset   Heart disease Unknown    Arthritis Unknown    Cancer Unknown    Diabetes Unknown     Social History Social History   Tobacco Use   Smoking status: Never   Smokeless tobacco: Never  Vaping Use   Vaping status: Never Used  Substance Use Topics   Alcohol use: No   Drug use: No     Allergies   Amoxicillin   Review of Systems Review of Systems Per HPI  Physical Exam Triage Vital Signs ED Triage Vitals  Encounter  Vitals Group     BP 03/22/23 1412 (!) 159/89     Systolic BP Percentile --      Diastolic BP Percentile --      Pulse Rate 03/22/23 1412 82     Resp 03/22/23 1412 20     Temp 03/22/23 1412 98.7 F (37.1 C)     Temp Source 03/22/23 1412 Oral     SpO2 03/22/23 1412 94 %     Weight --      Height --      Head Circumference --      Peak Flow --      Pain Score 03/22/23 1414 4     Pain Loc --      Pain Education --      Exclude from Growth Chart --    No data found.  Updated Vital Signs BP (!) 159/89 (BP Location: Right Arm)   Pulse 82   Temp 98.7 F (37.1 C) (Oral)   Resp 20   LMP 08/27/2016   SpO2 94%   Visual  Acuity Right Eye Distance:   Left Eye Distance:   Bilateral Distance:    Right Eye Near:   Left Eye Near:    Bilateral Near:     Physical Exam Vitals and nursing note reviewed.  Constitutional:      General: She is not in acute distress.    Appearance: Normal appearance.  HENT:     Head: Normocephalic.     Right Ear: Tympanic membrane, ear canal and external ear normal.     Left Ear: Tympanic membrane, ear canal and external ear normal.     Nose: Congestion present.     Right Turbinates: Enlarged and swollen.     Left Turbinates: Enlarged and swollen.     Right Sinus: Maxillary sinus tenderness present. No frontal sinus tenderness.     Left Sinus: Maxillary sinus tenderness present. No frontal sinus tenderness.     Mouth/Throat:     Mouth: Mucous membranes are moist.     Pharynx: No posterior oropharyngeal erythema.     Comments: Cobblestoning present to posterior oropharynx  Eyes:     Extraocular Movements: Extraocular movements intact.     Conjunctiva/sclera: Conjunctivae normal.     Pupils: Pupils are equal, round, and reactive to light.  Cardiovascular:     Rate and Rhythm: Normal rate and regular rhythm.     Pulses: Normal pulses.     Heart sounds: Normal heart sounds.  Pulmonary:     Effort: Pulmonary effort is normal. No respiratory distress.     Breath sounds: Normal breath sounds. No stridor. No wheezing, rhonchi or rales.  Abdominal:     General: Bowel sounds are normal.     Palpations: Abdomen is soft.     Tenderness: There is no abdominal tenderness.  Musculoskeletal:     Cervical back: Normal range of motion.  Lymphadenopathy:     Cervical: No cervical adenopathy.  Skin:    General: Skin is warm and dry.  Neurological:     General: No focal deficit present.     Mental Status: She is alert and oriented to person, place, and time.  Psychiatric:        Mood and Affect: Mood normal.        Behavior: Behavior normal.      UC Treatments / Results   Labs (all labs ordered are listed, but only abnormal results are displayed) Labs Reviewed - No data to display  EKG   Radiology No results found.  Procedures Procedures (including critical care time)  Medications Ordered in UC Medications - No data to display  Initial Impression / Assessment and Plan / UC Course  I have reviewed the triage vital signs and the nursing notes.  Pertinent labs & imaging results that were available during my care of the patient were reviewed by me and considered in my medical decision making (see chart for details).  Patient presents for complaints of continued sinus symptoms.  She was prescribed azithromycin more than 1 month ago, states sinus symptoms have continued to linger.  Will start patient on Levaquin 750 mg for the next 5 days.  Supportive care recommendations were provided and discussed with the patient to include fluids, rest, and over-the-counter analgesics.  Patient was advised if symptoms do not improve with this treatment, it is recommended that she follow-up with her PCP or ENT for further evaluation.  Patient was in agreement with this plan of care and verbalizes understanding.  All questions were answered.  Patient stable for discharge.  Final Clinical Impressions(s) / UC Diagnoses   Final diagnoses:  None   Discharge Instructions   None    ED Prescriptions   None    PDMP not reviewed this encounter.   Abran Cantor, NP 03/22/23 1447

## 2023-03-22 NOTE — ED Triage Notes (Signed)
Pt reports sinus pressure and facial pain has continued and reports bilateral ear pain as well. Pt reports symptoms have worsened last few days. Reports was on abx in November.

## 2023-03-22 NOTE — Discharge Instructions (Signed)
Take medication as directed. Recommend starting and over-the-counter allergy medication such as Allegra, Claritin, or Zyrtec daily. Increase fluids and get plenty of rest. May take over-the-counter ibuprofen or Tylenol as needed for pain, fever, or general discomfort. Recommend normal saline nasal spray to help with nasal congestion throughout the day. For your cough, it may be helpful to use a humidifier at bedtime during sleep. If symptoms do not improve with this treatment, it is recommended that you follow-up with your primary care physician or with ENT for further evaluation. Follow-up as needed.
